# Patient Record
Sex: Female | Born: 1990 | Hispanic: No | Marital: Single | Smoking: Former smoker
Health system: Southern US, Community
[De-identification: ages and names within clinical notes are randomized; demographics above are authoritative.]

## PROBLEM LIST (undated history)

## (undated) ENCOUNTER — Inpatient Hospital Stay (HOSPITAL_COMMUNITY): Payer: Self-pay

## (undated) DIAGNOSIS — Z789 Other specified health status: Secondary | ICD-10-CM

## (undated) HISTORY — PX: DILATION AND CURETTAGE OF UTERUS: SHX78

---
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
## 2011-11-10 ENCOUNTER — Encounter (HOSPITAL_COMMUNITY): Payer: Self-pay | Admitting: *Deleted

## 2011-11-10 DIAGNOSIS — R109 Unspecified abdominal pain: Secondary | ICD-10-CM | POA: Insufficient documentation

## 2011-11-10 DIAGNOSIS — O99891 Other specified diseases and conditions complicating pregnancy: Secondary | ICD-10-CM | POA: Insufficient documentation

## 2011-11-10 LAB — CBC
Hemoglobin: 10.2 g/dL — ABNORMAL LOW (ref 12.0–15.0)
MCHC: 30.7 g/dL (ref 30.0–36.0)
RBC: 4.02 MIL/uL (ref 3.87–5.11)

## 2011-11-10 LAB — DIFFERENTIAL
Basophils Relative: 0 % (ref 0–1)
Lymphs Abs: 2.5 10*3/uL (ref 0.7–4.0)
Monocytes Relative: 10 % (ref 3–12)
Neutro Abs: 3.8 10*3/uL (ref 1.7–7.7)
Neutrophils Relative %: 53 % (ref 43–77)

## 2011-11-10 LAB — URINALYSIS, ROUTINE W REFLEX MICROSCOPIC
Glucose, UA: NEGATIVE mg/dL
Leukocytes, UA: NEGATIVE
pH: 6.5 (ref 5.0–8.0)

## 2011-11-10 LAB — PREGNANCY, URINE: Preg Test, Ur: POSITIVE — AB

## 2011-11-10 NOTE — ED Notes (Signed)
The pt is c/o abd pain since the middle of this months and she is due for a period but it has not come on yet.  She has taken 2 preg tests.  One was neg and one was pos

## 2011-11-11 ENCOUNTER — Emergency Department (HOSPITAL_COMMUNITY)
Admission: EM | Admit: 2011-11-11 | Discharge: 2011-11-11 | Disposition: A | Discharge status: Home / Self Care | Payer: Medicaid Other | Attending: Emergency Medicine | Admitting: Emergency Medicine

## 2011-11-11 ENCOUNTER — Emergency Department (HOSPITAL_COMMUNITY): Payer: Medicaid Other

## 2011-11-11 LAB — COMPREHENSIVE METABOLIC PANEL
ALT: 17 U/L (ref 0–35)
Albumin: 3.3 g/dL — ABNORMAL LOW (ref 3.5–5.2)
Alkaline Phosphatase: 55 U/L (ref 39–117)
BUN: 9 mg/dL (ref 6–23)
Chloride: 103 mEq/L (ref 96–112)
Potassium: 3.8 mEq/L (ref 3.5–5.1)
Total Bilirubin: 0.1 mg/dL — ABNORMAL LOW (ref 0.3–1.2)

## 2011-11-11 LAB — ABO/RH: ABO/RH(D): A POS

## 2011-11-11 NOTE — ED Notes (Signed)
Patient transported to Ultrasound 

## 2011-11-11 NOTE — Discharge Instructions (Signed)
ABCs of Pregnancy A Antepartum care is very important. Be sure you see your doctor and get prenatal care as soon as you think you are pregnant. At this time, you will be tested for infection, genetic abnormalities and potential problems with you and the pregnancy. This is the time to discuss diet, exercise, work, medications, labor, pain medication during labor and the possibility of a cesarean delivery. Ask any questions that may concern you. It is important to see your doctor regularly throughout your pregnancy. Avoid exposure to toxic substances and chemicals - such as cleaning solvents, lead and mercury, some insecticides, and paint. Pregnant women should avoid exposure to paint fumes, and fumes that cause you to feel ill, dizzy or faint. When possible, it is a good idea to have a pre-pregnancy consultation with your caregiver to begin some important recommendations your caregiver suggests such as, taking folic acid, exercising, quitting smoking, avoiding alcoholic beverages, etc. B Breastfeeding is the healthiest choice for both you and your baby. It has many nutritional benefits for the baby and health benefits for the mother. It also creates a very tight and loving bond between the baby and mother. Talk to your doctor, your family and friends, and your employer about how you choose to feed your baby and how they can support you in your decision. Not all birth defects can be prevented, but a woman can take actions that may increase her chance of having a healthy baby. Many birth defects happen very early in pregnancy, sometimes before a woman even knows she is pregnant. Birth defects or abnormalities of any child in your or the father's family should be discussed with your caregiver. Get a good support bra as your breast size changes. Wear it especially when you exercise and when nursing.  C Celebrate the news of your pregnancy with the your spouse/father and family. Childbirth classes are helpful to  take for you and the spouse/father because it helps to understand what happens during the pregnancy, labor and delivery. Cesarean delivery should be discussed with your doctor so you are prepared for that possibility. The pros and cons of circumcision if it is a boy, should be discussed with your pediatrician. Cigarette smoking during pregnancy can result in low birth weight babies. It has been associated with infertility, miscarriages, tubal pregnancies, infant death (mortality) and poor health (morbidity) in childhood. Additionally, cigarette smoking may cause long-term learning disabilities. If you smoke, you should try to quit before getting pregnant and not smoke during the pregnancy. Secondary smoke may also harm a mother and her developing baby. It is a good idea to ask people to stop smoking around you during your pregnancy and after the baby is born. Extra calcium is necessary when you are pregnant and is found in your prenatal vitamin, in dairy products, green leafy vegetables and in calcium supplements. D A healthy diet according to your current weight and height, along with vitamins and mineral supplements should be discussed with your caregiver. Domestic abuse or violence should be made known to your doctor right away to get the situation corrected. Drink more water when you exercise to keep hydrated. Discomfort of your back and legs usually develops and progresses from the middle of the second trimester through to delivery of the baby. This is because of the enlarging baby and uterus, which may also affect your balance. Do not take illegal drugs. Illegal drugs can seriously harm the baby and you. Drink extra fluids (water is best) throughout pregnancy to help   your body keep up with the increases in your blood volume. Drink at least 6 to 8 glasses of water, fruit juice, or milk each day. A good way to know you are drinking enough fluid is when your urine looks almost like clear water or is very light  yellow.  E Eat healthy to get the nutrients you and your unborn baby need. Your meals should include the five basic food groups. Exercise (30 minutes of light to moderate exercise a day) is important and encouraged during pregnancy, if there are no medical problems or problems with the pregnancy. Exercise that causes discomfort or dizziness should be stopped and reported to your caregiver. Emotions during pregnancy can change from being ecstatic to depression and should be understood by you, your partner and your family. F Fetal screening with ultrasound, amniocentesis and monitoring during pregnancy and labor is common and sometimes necessary. Take 400 micrograms of folic acid daily both before, when possible, and during the first few months of pregnancy to reduce the risk of birth defects of the brain and spine. All women who could possibly become pregnant should take a vitamin with folic acid, every day. It is also important to eat a healthy diet with fortified foods (enriched grain products, including cereals, rice, breads, and pastas) and foods with natural sources of folate (orange juice, green leafy vegetables, beans, peanuts, broccoli, asparagus, peas, and lentils). The father should be involved with all aspects of the pregnancy including, the prenatal care, childbirth classes, labor, delivery, and postpartum time. Fathers may also have emotional concerns about being a father, financial needs, and raising a family. G Genetic testing should be done appropriately. It is important to know your family and the father's history. If there have been problems with pregnancies or birth defects in your family, report these to your doctor. Also, genetic counselors can talk with you about the information you might need in making decisions about having a family. You can call a major medical center in your area for help in finding a board-certified genetic counselor. Genetic testing and counseling should be done  before pregnancy when possible, especially if there is a history of problems in the mother's or father's family. Certain ethnic backgrounds are more at risk for genetic defects. H Get familiar with the hospital where you will be having your baby. Get to know how long it takes to get there, the labor and delivery area, and the hospital procedures. Be sure your medical insurance is accepted there. Get your home ready for the baby including, clothes, the baby's room (when possible), furniture and car seat. Hand washing is important throughout the day, especially after handling raw meat and poultry, changing the baby's diaper or using the bathroom. This can help prevent the spread of many bacteria and viruses that cause infection. Your hair may become dry and thinner, but will return to normal a few weeks after the baby is born. Heartburn is a common problem that can be treated by taking antacids recommended by your caregiver, eating smaller meals 5 or 6 times a day, not drinking liquids when eating, drinking between meals and raising the head of your bed 2 to 3 inches. I Insurance to cover you, the baby, doctor and hospital should be reviewed so that you will be prepared to pay any costs not covered by your insurance plan. If you do not have medical insurance, there are usually clinics and services available for you in your community. Take 30 milligrams of iron during   your pregnancy as prescribed by your doctor to reduce the risk of low red blood cells (anemia) later in pregnancy. All women of childbearing age should eat a diet rich in iron. J There should be a joint effort for the mother, father and any other children to adapt to the pregnancy financially, emotionally, and psychologically during the pregnancy. Join a support group for moms-to-be. Or, join a class on parenting or childbirth. Have the family participate when possible. K Know your limits. Let your caregiver know if you experience any of the  following:   Pain of any kind.   Strong cramps.   You develop a lot of weight in a short period of time (5 pounds in 3 to 5 days).   Vaginal bleeding, leaking of amniotic fluid.   Headache, vision problems.   Dizziness, fainting, shortness of breath.   Chest pain.   Fever of 102 F (38.9 C) or higher.   Gush of clear fluid from your vagina.   Painful urination.   Domestic violence.   Irregular heartbeat (palpitations).   Rapid beating of the heart (tachycardia).   Constant feeling sick to your stomach (nauseous) and vomiting.   Trouble walking, fluid retention (edema).   Muscle weakness.   If your baby has decreased activity.   Persistent diarrhea.   Abnormal vaginal discharge.   Uterine contractions at 20-minute intervals.   Back pain that travels down your leg.  L Learn and practice that what you eat and drink should be in moderation and healthy for you and your baby. Legal drugs such as alcohol and caffeine are important issues for pregnant women. There is no safe amount of alcohol a woman can drink while pregnant. Fetal alcohol syndrome, a disorder characterized by growth retardation, facial abnormalities, and central nervous system dysfunction, is caused by a woman's use of alcohol during pregnancy. Caffeine, found in tea, coffee, soft drinks and chocolate, should also be limited. Be sure to read labels when trying to cut down on caffeine during pregnancy. More than 200 foods, beverages, and over-the-counter medications contain caffeine and have a high salt content! There are coffees and teas that do not contain caffeine. M Medical conditions such as diabetes, epilepsy, and high blood pressure should be treated and kept under control before pregnancy when possible, but especially during pregnancy. Ask your caregiver about any medications that may need to be changed or adjusted during pregnancy. If you are currently taking any medications, ask your caregiver if it  is safe to take them while you are pregnant or before getting pregnant when possible. Also, be sure to discuss any herbs or vitamins you are taking. They are medicines, too! Discuss with your doctor all medications, prescribed and over-the-counter, that you are taking. During your prenatal visit, discuss the medications your doctor may give you during labor and delivery. N Never be afraid to ask your doctor or caregiver questions about your health, the progress of the pregnancy, family problems, stressful situations, and recommendation for a pediatrician, if you do not have one. It is better to take all precautions and discuss any questions or concerns you may have during your office visits. It is a good idea to write down your questions before you visit the doctor. O Over-the-counter cough and cold remedies may contain alcohol or other ingredients that should be avoided during pregnancy. Ask your caregiver about prescription, herbs or over-the-counter medications that you are taking or may consider taking while pregnant.  P Physical activity during pregnancy can   benefit both you and your baby by lessening discomfort and fatigue, providing a sense of well-being, and increasing the likelihood of early recovery after delivery. Light to moderate exercise during pregnancy strengthens the belly (abdominal) and back muscles. This helps improve posture. Practicing yoga, walking, swimming, and cycling on a stationary bicycle are usually safe exercises for pregnant women. Avoid scuba diving, exercise at high altitudes (over 3000 feet), skiing, horseback riding, contact sports, etc. Always check with your doctor before beginning any kind of exercise, especially during pregnancy and especially if you did not exercise before getting pregnant. Q Queasiness, stomach upset and morning sickness are common during pregnancy. Eating a couple of crackers or dry toast before getting out of bed. Foods that you normally love may  make you feel sick to your stomach. You may need to substitute other nutritious foods. Eating 5 or 6 small meals a day instead of 3 large ones may make you feel better. Do not drink with your meals, drink between meals. Questions that you have should be written down and asked during your prenatal visits. R Read about and make plans to baby-proof your home. There are important tips for making your home a safer environment for your baby. Review the tips and make your home safer for you and your baby. Read food labels regarding calories, salt and fat content in the food. S Saunas, hot tubs, and steam rooms should be avoided while you are pregnant. Excessive high heat may be harmful during your pregnancy. Your caregiver will screen and examine you for sexually transmitted diseases and genetic disorders during your prenatal visits. Learn the signs of labor. Sexual relations while pregnant is safe unless there is a medical or pregnancy problem and your caregiver advises against it. T Traveling long distances should be avoided especially in the third trimester of your pregnancy. If you do have to travel out of state, be sure to take a copy of your medical records and medical insurance plan with you. You should not travel long distances without seeing your doctor first. Most airlines will not allow you to travel after 36 weeks of pregnancy. Toxoplasmosis is an infection caused by a parasite that can seriously harm an unborn baby. Avoid eating undercooked meat and handling cat litter. Be sure to wear gloves when gardening. Tingling of the hands and fingers is not unusual and is due to fluid retention. This will go away after the baby is born. U Womb (uterus) size increases during the first trimester. Your kidneys will begin to function more efficiently. This may cause you to feel the need to urinate more often. You may also leak urine when sneezing, coughing or laughing. This is due to the growing uterus pressing  against your bladder, which lies directly in front of and slightly under the uterus during the first few months of pregnancy. If you experience burning along with frequency of urination or bloody urine, be sure to tell your doctor. The size of your uterus in the third trimester may cause a problem with your balance. It is advisable to maintain good posture and avoid wearing high heels during this time. An ultrasound of your baby may be necessary during your pregnancy and is safe for you and your baby. V Vaccinations are an important concern for pregnant women. Get needed vaccines before pregnancy. Center for Disease Control (www.cdc.gov) has clear guidelines for the use of vaccines during pregnancy. Review the list, be sure to discuss it with your doctor. Prenatal vitamins are helpful   and healthy for you and the baby. Do not take extra vitamins except what is recommended. Taking too much of certain vitamins can cause overdose problems. Continuous vomiting should be reported to your caregiver. Varicose veins may appear especially if there is a family history of varicose veins. They should subside after the delivery of the baby. Support hose helps if there is leg discomfort. W Being overweight or underweight during pregnancy may cause problems. Try to get within 15 pounds of your ideal weight before pregnancy. Remember, pregnancy is not a time to be dieting! Do not stop eating or start skipping meals as your weight increases. Both you and your baby need the calories and nutrition you receive from a healthy diet. Be sure to consult with your doctor about your diet. There is a formula and diet plan available depending on whether you are overweight or underweight. Your caregiver or nutritionist can help and advise you if necessary. X Avoid X-rays. If you must have dental work or diagnostic tests, tell your dentist or physician that you are pregnant so that extra care can be taken. X-rays should only be taken when  the risks of not taking them outweigh the risk of taking them. If needed, only the minimum amount of radiation should be used. When X-rays are necessary, protective lead shields should be used to cover areas of the body that are not being X-rayed. Y Your baby loves you. Breastfeeding your baby creates a loving and very close bond between the two of you. Give your baby a healthy environment to live in while you are pregnant. Infants and children require constant care and guidance. Their health and safety should be carefully watched at all times. After the baby is born, rest or take a nap when the baby is sleeping. Z Get your ZZZs. Be sure to get plenty of rest. Resting on your side as often as possible, especially on your left side is advised. It provides the best circulation to your baby and helps reduce swelling. Try taking a nap for 30 to 45 minutes in the afternoon when possible. After the baby is born rest or take a nap when the baby is sleeping. Try elevating your feet for that amount of time when possible. It helps the circulation in your legs and helps reduce swelling.  Most information courtesy of the CDC. Document Released: 05/05/2005 Document Revised: 04/24/2011 Document Reviewed: 01/17/2009 [employer] Patient Information 2012 [employer]. 

## 2011-11-11 NOTE — ED Provider Notes (Signed)
History     CSN: [id number]  Arrival date & time 11/10/11  2232   First MD Initiated Contact with Patient 11/11/11 0103      Chief Complaint  Patient presents with  . Abdominal Pain    (Consider location/radiation/quality/duration/timing/severity/associated sxs/prior treatment) HPI History provided by patient. LMP of month ago. Patient concerned she may be pregnant with positive home pregnancy test and having some lower bowel discomfort. Some intermittent nausea but no vomiting. Currently without nausea. No vaginal discharge. Is sexually active. No trauma. No diarrhea. Does not have an OB/GYN. No history of ectopic pregnancy. No lateralizing pelvic pain. No back pain. No dysuria. History reviewed. No pertinent past medical history.  History reviewed. No pertinent past surgical history.  No family history on file.  History  Substance Use Topics  . Smoking status: Never Smoker   . Smokeless tobacco: Not on file  . Alcohol Use: No    OB History    Grav Para Term Preterm Abortions TAB SAB Ect Mult Living                  Review of Systems  Constitutional: Negative for fever and chills.  HENT: Negative for neck pain and neck stiffness.   Eyes: Negative for pain.  Respiratory: Negative for shortness of breath.   Cardiovascular: Negative for chest pain.  Gastrointestinal: Positive for abdominal pain. Negative for blood in stool and abdominal distention.  Genitourinary: Negative for dysuria.  Musculoskeletal: Negative for back pain.  Skin: Negative for rash.  Neurological: Negative for headaches.  All other systems reviewed and are negative.    Allergies  Review of patient's allergies indicates no known allergies.  Home Medications  No current outpatient prescriptions on file.  BP 129/84  Pulse 91  Temp 98.3 F (36.8 C) (Oral)  Resp 18  SpO2 100%  LMP 10/27/2011  Physical Exam  Constitutional: She is oriented to person, place, and time. She appears  well-developed and well-nourished.  HENT:  Head: Normocephalic and atraumatic.  Eyes: Conjunctivae and EOM are normal. Pupils are equal, round, and reactive to light.  Neck: Trachea normal. Neck supple. No thyromegaly present.  Cardiovascular: Normal rate, regular rhythm, S1 normal, S2 normal and normal pulses.     No systolic murmur is present   No diastolic murmur is present  Pulses:      Radial pulses are 2+ on the right side, and 2+ on the left side.  Pulmonary/Chest: Effort normal and breath sounds normal. She has no wheezes. She has no rhonchi. She has no rales. She exhibits no tenderness.  Abdominal: Soft. Normal appearance and bowel sounds are normal. She exhibits no distension and no mass. There is no tenderness. There is no rebound, no guarding, no CVA tenderness and negative Murphy's sign.  Musculoskeletal:       BLE:s Calves nontender, no cords or erythema, negative Homans sign  Neurological: She is alert and oriented to person, place, and time. She has normal strength. No cranial nerve deficit or sensory deficit. GCS eye subscore is 4. GCS verbal subscore is 5. GCS motor subscore is 6.  Skin: Skin is warm and dry. No rash noted. She is not diaphoretic.  Psychiatric: Her speech is normal.       Cooperative and appropriate    ED Course  Procedures (including critical care time)  Labs Reviewed  PREGNANCY, URINE - Abnormal; Notable for the following:    Preg Test, Ur POSITIVE (*)     All other components  within normal limits  CBC - Abnormal; Notable for the following:    Hemoglobin 10.2 (*)     HCT 33.2 (*)     MCH 25.4 (*)     All other components within normal limits  COMPREHENSIVE METABOLIC PANEL - Abnormal; Notable for the following:    Albumin 3.3 (*)     Total Bilirubin 0.1 (*)     All other components within normal limits  HCG, QUANTITATIVE, PREGNANCY - Abnormal; Notable for the following:    hCG, Beta Chain, Quant, S 7173 (*)     All other components within  normal limits  URINALYSIS, ROUTINE W REFLEX MICROSCOPIC  DIFFERENTIAL  ABO/RH   US Ob Comp Less 14 Wks  11/11/2011  *RADIOLOGY REPORT*  Clinical Data: 21 year old female with abdominal pain. Quantitative beta HCG pending.  Gestational age by LMP 5 weeks and 4 days.  OBSTETRIC <14 WK [place] AND TRANSVAGINAL OB US  Technique:  Both transabdominal and transvaginal ultrasound examinations were performed for complete evaluation of the gestation as well as the maternal uterus, adnexal regions, and pelvic cul-de-sac.  Transvaginal technique was performed to assess early pregnancy.  Comparison:  None.  Intrauterine gestational sac:  Small, single. Yolk sac: Not visible Embryo: Not visible  MSD: 6.7  mm  five    w three    d       [place] EDC: 07/10/2012  Maternal uterus/adnexae: No subchorionic hemorrhage or pelvic free fluid.  The right ovary measures 3.5 x 1.7 x 1.8 cm and is within normal limits. The left ovary measures 2.3 x 3.2 x 2.7 cm and contains a round echogenic structure measuring 1.8 cm diameter compatible with the corpus luteum.  IMPRESSION: 1.  Evidence of a small intrauterine gestational sac.  No yolk sac or embryo visible. 2.  No subchorionic hemorrhage or pelvic free fluid.  Left ovary probable corpus luteum. 3.  Correlation with serial quantitative BHCG and followup imaging will be necessary to fully exclude ectopic pregnancy.  Original Report Authenticated By: Harley Hallmark, M.D.   US Ob Transvaginal  11/11/2011  *RADIOLOGY REPORT*  Clinical Data: 21 year old female with abdominal pain. Quantitative beta HCG pending.  Gestational age by LMP 5 weeks and 4 days.  OBSTETRIC <14 WK [place] AND TRANSVAGINAL OB US  Technique:  Both transabdominal and transvaginal ultrasound examinations were performed for complete evaluation of the gestation as well as the maternal uterus, adnexal regions, and pelvic cul-de-sac.  Transvaginal technique was performed to assess early pregnancy.  Comparison:  None.  Intrauterine  gestational sac:  Small, single. Yolk sac: Not visible Embryo: Not visible  MSD: 6.7  mm  five    w three    d       [place] EDC: 07/10/2012  Maternal uterus/adnexae: No subchorionic hemorrhage or pelvic free fluid.  The right ovary measures 3.5 x 1.7 x 1.8 cm and is within normal limits. The left ovary measures 2.3 x 3.2 x 2.7 cm and contains a round echogenic structure measuring 1.8 cm diameter compatible with the corpus luteum.  IMPRESSION: 1.  Evidence of a small intrauterine gestational sac.  No yolk sac or embryo visible. 2.  No subchorionic hemorrhage or pelvic free fluid.  Left ovary probable corpus luteum. 3.  Correlation with serial quantitative BHCG and followup imaging will be necessary to fully exclude ectopic pregnancy.  Original Report Authenticated By: Harley Hallmark, M.D.    3:14 AM [place] and BhCG d/w radiologist who doubts ectopic and feels this  is an early IUP.   Serial abdominal exams without tenderness or indication for imaging. After ultrasound reviewed, result shared with patient and after discussion has been seen in the women's clinic in the past. Plan followup 48 hours for repeat hCG as indicated MDM   Positive pregnancy test with early IUP by ultrasound. Plan close OB/GYN followup in the clinic. Stable for discharge home with the indication for further workup or evaluation at this time. Patient agrees to start prenatal vitamins.        Sunnie Nielsen, MD 11/11/11 [phone]

## 2011-11-11 NOTE — ED Notes (Signed)
Visitors at bedside, nad noted, abc intact awaiting furthur orders and disposition.

## 2012-02-11 LAB — OB RESULTS CONSOLE ANTIBODY SCREEN: Antibody Screen: NEGATIVE

## 2012-02-11 LAB — OB RESULTS CONSOLE RPR: RPR: NONREACTIVE

## 2012-02-11 LAB — OB RESULTS CONSOLE RUBELLA ANTIBODY, IGM: Rubella: IMMUNE

## 2012-02-11 LAB — OB RESULTS CONSOLE HEPATITIS B SURFACE ANTIGEN: Hepatitis B Surface Ag: NEGATIVE

## 2012-02-11 LAB — OB RESULTS CONSOLE GC/CHLAMYDIA: Chlamydia: NEGATIVE

## 2012-04-16 ENCOUNTER — Encounter (HOSPITAL_COMMUNITY): Payer: Self-pay

## 2012-04-16 ENCOUNTER — Inpatient Hospital Stay (HOSPITAL_COMMUNITY)
Admission: AD | Admit: 2012-04-16 | Discharge: 2012-04-16 | Disposition: A | Discharge status: Home / Self Care | Payer: Medicaid Other | Source: Ambulatory Visit | Attending: Obstetrics and Gynecology | Admitting: Obstetrics and Gynecology

## 2012-04-16 DIAGNOSIS — B9689 Other specified bacterial agents as the cause of diseases classified elsewhere: Secondary | ICD-10-CM | POA: Insufficient documentation

## 2012-04-16 DIAGNOSIS — N938 Other specified abnormal uterine and vaginal bleeding: Secondary | ICD-10-CM | POA: Insufficient documentation

## 2012-04-16 DIAGNOSIS — A499 Bacterial infection, unspecified: Secondary | ICD-10-CM

## 2012-04-16 DIAGNOSIS — N949 Unspecified condition associated with female genital organs and menstrual cycle: Secondary | ICD-10-CM | POA: Insufficient documentation

## 2012-04-16 DIAGNOSIS — N760 Acute vaginitis: Secondary | ICD-10-CM | POA: Insufficient documentation

## 2012-04-16 HISTORY — DX: Other specified health status: Z78.9

## 2012-04-16 LAB — WET PREP, GENITAL: Trich, Wet Prep: NONE SEEN

## 2012-04-16 MED ORDER — METRONIDAZOLE 500 MG PO TABS: 500.0000 mg | ORAL_TABLET | Freq: Two times a day (BID) | ORAL | Status: DC

## 2012-04-16 NOTE — MAU Note (Signed)
Bleeding started this morning.  Denies recent intercourse, low lying placenta or previa.  No pain.  Initially started as a "lot of water, then blood; not so much now- only wearing panti liner".

## 2012-04-16 NOTE — MAU Provider Note (Signed)
  History     CSN: [id number]  Arrival date and time: 04/16/12 1019   First Provider Initiated Contact with Patient 04/16/12 1036      Chief Complaint  Patient presents with  . Vaginal Bleeding   HPI  Katherine Ayers is a 21 y.o. [id number]. She presents today with vaginal bleeding. She states that it started about an hour ago. She had filled a pantyliner with blood and passed a dime sized clot. She denies any pain. She confirms fetal movement. Most recent intercourse was 3 days ago. She denies any problems with the pregnancy.   No past medical history on file.  No past surgical history on file.  No family history on file.  History  Substance Use Topics  . Smoking status: Never Smoker   . Smokeless tobacco: Not on file  . Alcohol Use: No    Allergies: No Known Allergies  No prescriptions prior to admission    Review of Systems  Constitutional: Negative for fever.  Eyes: Negative for blurred vision.  Gastrointestinal: Negative for nausea, vomiting, abdominal pain, diarrhea and constipation.  Genitourinary: Negative for dysuria, urgency and frequency.  Neurological: Negative for headaches.   Physical Exam   There were no vitals taken for this visit.  Physical Exam  Nursing note and vitals reviewed. Constitutional: She is oriented to person, place, and time. She appears well-developed and well-nourished.  Cardiovascular: Normal rate.   Respiratory: Effort normal.  GI: Soft. She exhibits no distension.  Genitourinary:        External: normal Vagina: copious thick, yellow discharge. No pooling of fluid, no fluid seen from cervical os with valsalva. No blood seen in vault.  Cervix: closed/thick/high  Neurological: She is alert and oriented to person, place, and time.  Skin: Skin is warm and dry.  Psychiatric: She has a normal mood and affect.    MAU Course  Procedures  Dr. Dareen Piano notified of patient status. Ok for [employer] home once wet prep results are back.    Results for orders placed during the hospital encounter of 04/16/12 (from the past 24 hour(s))  WET PREP, GENITAL     Status: Abnormal   Collection Time   04/16/12 10:48 AM      Component Value Range   Yeast Wet Prep HPF POC NONE SEEN  NONE SEEN   Trich, Wet Prep NONE SEEN  NONE SEEN   Clue Cells Wet Prep HPF POC MODERATE (*) NONE SEEN   WBC, Wet Prep HPF POC MANY (*) NONE SEEN    Assessment and Plan  Bacterial Vaginosis Flagyl 500mg  PO [id number] FU with pcp as needed.  Tawnya Crook 04/16/2012, 10:36 AM

## 2012-04-18 LAB — GC/CHLAMYDIA PROBE AMP
CT Probe RNA: NEGATIVE
GC Probe RNA: NEGATIVE

## 2012-05-19 NOTE — L&D Delivery Note (Signed)
Delivery Note At 5:58 PM a viable and healthy female was delivered via Vaginal, Spontaneous Delivery (Presentation: Left Occiput Anterior).  APGAR: 9, 9; weight Pending.  Placenta status: Intact, Spontaneous.  Cord: 3 vessels   Anesthesia: Epidural, 10cc 1% lidocaine Episiotomy: None Lacerations: Right vaginal laceration Suture Repair: 3.0 vicryl Est. Blood Loss (mL): 400  Mom to postpartum.  Baby to nursery-stable.  Roseland Braun H. 07/09/2012, 7:08 PM

## 2012-07-09 ENCOUNTER — Encounter (HOSPITAL_COMMUNITY): Payer: Self-pay | Admitting: Anesthesiology

## 2012-07-09 ENCOUNTER — Encounter (HOSPITAL_COMMUNITY): Payer: Self-pay | Admitting: *Deleted

## 2012-07-09 ENCOUNTER — Inpatient Hospital Stay (HOSPITAL_COMMUNITY): Payer: Medicaid Other | Admitting: Anesthesiology

## 2012-07-09 ENCOUNTER — Inpatient Hospital Stay (HOSPITAL_COMMUNITY)
Admission: AD | Admit: 2012-07-09 | Discharge: 2012-07-11 | DRG: 775 | Disposition: A | Discharge status: Home / Self Care | Payer: Medicaid Other | Source: Ambulatory Visit | Attending: Obstetrics and Gynecology | Admitting: Obstetrics and Gynecology

## 2012-07-09 DIAGNOSIS — D649 Anemia, unspecified: Secondary | ICD-10-CM | POA: Diagnosis not present

## 2012-07-09 DIAGNOSIS — Z22330 Carrier of Group B streptococcus: Secondary | ICD-10-CM

## 2012-07-09 DIAGNOSIS — O99892 Other specified diseases and conditions complicating childbirth: Secondary | ICD-10-CM | POA: Diagnosis present

## 2012-07-09 DIAGNOSIS — O9903 Anemia complicating the puerperium: Secondary | ICD-10-CM | POA: Diagnosis not present

## 2012-07-09 LAB — CBC
HCT: 29 % — ABNORMAL LOW (ref 36.0–46.0)
MCHC: 30 g/dL (ref 30.0–36.0)
Platelets: 213 10*3/uL (ref 150–400)
RDW: 16.6 % — ABNORMAL HIGH (ref 11.5–15.5)

## 2012-07-09 MED ORDER — FENTANYL 2.5 MCG/ML BUPIVACAINE 1/10 % EPIDURAL INFUSION (WH - ANES)
14.0000 mL/h | INTRAMUSCULAR | Status: DC
  Administered 2012-07-09: 14 mL/h via EPIDURAL
  Filled 2012-07-09: qty 125

## 2012-07-09 MED ORDER — METHYLERGONOVINE MALEATE 0.2 MG/ML IJ SOLN: 0.2000 mg | INTRAMUSCULAR | Status: DC | PRN

## 2012-07-09 MED ORDER — OXYCODONE-ACETAMINOPHEN 5-325 MG PO TABS
1.0000 | ORAL_TABLET | ORAL | Status: DC | PRN
  Administered 2012-07-09: 1 via ORAL
  Filled 2012-07-09: qty 1

## 2012-07-09 MED ORDER — IBUPROFEN 600 MG PO TABS
600.0000 mg | ORAL_TABLET | Freq: Four times a day (QID) | ORAL | Status: DC
  Administered 2012-07-09 – 2012-07-11 (×8): 600 mg via ORAL
  Filled 2012-07-09 (×9): qty 1

## 2012-07-09 MED ORDER — FLEET ENEMA 7-19 GM/118ML RE ENEM: 1.0000 | ENEMA | Freq: Every day | RECTAL | Status: DC | PRN

## 2012-07-09 MED ORDER — INFLUENZA VIRUS VACC SPLIT PF IM SUSP
0.5000 mL | INTRAMUSCULAR | Status: AC
  Administered 2012-07-10: 0.5 mL via INTRAMUSCULAR
  Filled 2012-07-09: qty 0.5

## 2012-07-09 MED ORDER — OXYCODONE-ACETAMINOPHEN 5-325 MG PO TABS: 1.0000 | ORAL_TABLET | ORAL | Status: DC | PRN

## 2012-07-09 MED ORDER — LACTATED RINGERS IV SOLN
500.0000 mL | Freq: Once | INTRAVENOUS | Status: AC
  Administered 2012-07-09: 500 mL via INTRAVENOUS

## 2012-07-09 MED ORDER — OXYTOCIN 40 UNITS IN LACTATED RINGERS INFUSION - SIMPLE MED
62.5000 mL/h | INTRAVENOUS | Status: DC
  Filled 2012-07-09: qty 1000

## 2012-07-09 MED ORDER — ONDANSETRON HCL 4 MG/2ML IJ SOLN: 4.0000 mg | Freq: Four times a day (QID) | INTRAMUSCULAR | Status: DC | PRN

## 2012-07-09 MED ORDER — PHENYLEPHRINE 40 MCG/ML (10ML) SYRINGE FOR IV PUSH (FOR BLOOD PRESSURE SUPPORT): 80.0000 ug | PREFILLED_SYRINGE | INTRAVENOUS | Status: DC | PRN

## 2012-07-09 MED ORDER — PRENATAL MULTIVITAMIN CH
1.0000 | ORAL_TABLET | Freq: Every day | ORAL | Status: DC
  Administered 2012-07-09 – 2012-07-11 (×3): 1 via ORAL
  Filled 2012-07-09 (×4): qty 1

## 2012-07-09 MED ORDER — IBUPROFEN 600 MG PO TABS: 600.0000 mg | ORAL_TABLET | Freq: Four times a day (QID) | ORAL | Status: DC | PRN

## 2012-07-09 MED ORDER — LACTATED RINGERS IV SOLN: 500.0000 mL | INTRAVENOUS | Status: DC | PRN

## 2012-07-09 MED ORDER — LANOLIN HYDROUS EX OINT: TOPICAL_OINTMENT | CUTANEOUS | Status: DC | PRN

## 2012-07-09 MED ORDER — BENZOCAINE-MENTHOL 20-0.5 % EX AERO
1.0000 "application " | INHALATION_SPRAY | CUTANEOUS | Status: DC | PRN
  Administered 2012-07-09: 1 via TOPICAL
  Filled 2012-07-09: qty 56

## 2012-07-09 MED ORDER — ACETAMINOPHEN 325 MG PO TABS: 650.0000 mg | ORAL_TABLET | ORAL | Status: DC | PRN

## 2012-07-09 MED ORDER — LIDOCAINE HCL (PF) 1 % IJ SOLN
30.0000 mL | INTRAMUSCULAR | Status: DC | PRN
  Administered 2012-07-09: 30 mL via SUBCUTANEOUS
  Filled 2012-07-09 (×2): qty 30

## 2012-07-09 MED ORDER — OXYTOCIN BOLUS FROM INFUSION
500.0000 mL | INTRAVENOUS | Status: DC
  Administered 2012-07-09: 500 mL via INTRAVENOUS

## 2012-07-09 MED ORDER — ONDANSETRON HCL 4 MG/2ML IJ SOLN: 4.0000 mg | INTRAMUSCULAR | Status: DC | PRN

## 2012-07-09 MED ORDER — DIPHENHYDRAMINE HCL 25 MG PO CAPS: 25.0000 mg | ORAL_CAPSULE | Freq: Four times a day (QID) | ORAL | Status: DC | PRN

## 2012-07-09 MED ORDER — SODIUM CHLORIDE 0.9 % IV SOLN
2.0000 g | Freq: Four times a day (QID) | INTRAVENOUS | Status: DC
  Administered 2012-07-09: 2 g via INTRAVENOUS
  Filled 2012-07-09 (×3): qty 2000

## 2012-07-09 MED ORDER — PHENYLEPHRINE 40 MCG/ML (10ML) SYRINGE FOR IV PUSH (FOR BLOOD PRESSURE SUPPORT)
80.0000 ug | PREFILLED_SYRINGE | INTRAVENOUS | Status: DC | PRN
  Filled 2012-07-09: qty 5

## 2012-07-09 MED ORDER — SENNOSIDES-DOCUSATE SODIUM 8.6-50 MG PO TABS
2.0000 | ORAL_TABLET | Freq: Every day | ORAL | Status: DC
  Administered 2012-07-09 – 2012-07-10 (×2): 2 via ORAL

## 2012-07-09 MED ORDER — TETANUS-DIPHTH-ACELL PERTUSSIS 5-2.5-18.5 LF-MCG/0.5 IM SUSP
0.5000 mL | Freq: Once | INTRAMUSCULAR | Status: AC
  Administered 2012-07-10: 0.5 mL via INTRAMUSCULAR
  Filled 2012-07-09: qty 0.5

## 2012-07-09 MED ORDER — EPHEDRINE 5 MG/ML INJ: 10.0000 mg | INTRAVENOUS | Status: DC | PRN

## 2012-07-09 MED ORDER — METHYLERGONOVINE MALEATE 0.2 MG PO TABS: 0.2000 mg | ORAL_TABLET | ORAL | Status: DC | PRN

## 2012-07-09 MED ORDER — BUTORPHANOL TARTRATE 1 MG/ML IJ SOLN: 1.0000 mg | INTRAMUSCULAR | Status: DC | PRN

## 2012-07-09 MED ORDER — EPHEDRINE 5 MG/ML INJ
10.0000 mg | INTRAVENOUS | Status: DC | PRN
  Filled 2012-07-09: qty 4

## 2012-07-09 MED ORDER — LIDOCAINE HCL (PF) 1 % IJ SOLN
INTRAMUSCULAR | Status: DC | PRN
  Administered 2012-07-09 (×4): 4 mL

## 2012-07-09 MED ORDER — ZOLPIDEM TARTRATE 5 MG PO TABS: 5.0000 mg | ORAL_TABLET | Freq: Every evening | ORAL | Status: DC | PRN

## 2012-07-09 MED ORDER — ONDANSETRON HCL 4 MG PO TABS: 4.0000 mg | ORAL_TABLET | ORAL | Status: DC | PRN

## 2012-07-09 MED ORDER — SIMETHICONE 80 MG PO CHEW: 80.0000 mg | CHEWABLE_TABLET | ORAL | Status: DC | PRN

## 2012-07-09 MED ORDER — DIBUCAINE 1 % RE OINT: 1.0000 "application " | TOPICAL_OINTMENT | RECTAL | Status: DC | PRN

## 2012-07-09 MED ORDER — DIPHENHYDRAMINE HCL 50 MG/ML IJ SOLN: 12.5000 mg | INTRAMUSCULAR | Status: DC | PRN

## 2012-07-09 MED ORDER — CITRIC ACID-SODIUM CITRATE 334-500 MG/5ML PO SOLN: 30.0000 mL | ORAL | Status: DC | PRN

## 2012-07-09 MED ORDER — WITCH HAZEL-GLYCERIN EX PADS
1.0000 "application " | MEDICATED_PAD | CUTANEOUS | Status: DC | PRN
  Administered 2012-07-10: 1 via TOPICAL

## 2012-07-09 MED ORDER — LACTATED RINGERS IV SOLN
INTRAVENOUS | Status: DC
  Administered 2012-07-09 (×2): via INTRAVENOUS

## 2012-07-09 NOTE — MAU Note (Signed)
Pt presents to r/o labor.  Contractions started at 0400 this morning, and are every .  Denies any LOF or bleeding.  Reports good fetal movement.

## 2012-07-09 NOTE — Anesthesia Preprocedure Evaluation (Signed)
Anesthesia Evaluation  Patient identified by MRN, date of birth, ID band Patient awake    Reviewed: Allergy & Precautions, H&P , NPO status , Patient's Chart, lab work & pertinent test results, reviewed documented beta blocker date and time   History of Anesthesia Complications Negative for: history of anesthetic complications  Airway Mallampati: I TM Distance: >3 FB Neck ROM: full    Dental  (+) Teeth Intact   Pulmonary neg pulmonary ROS,  breath sounds clear to auscultation        Cardiovascular negative cardio ROS  Rhythm:regular Rate:Normal     Neuro/Psych negative neurological ROS  negative psych ROS   GI/Hepatic negative GI ROS, Neg liver ROS,   Endo/Other  negative endocrine ROS  Renal/GU negative Renal ROS     Musculoskeletal   Abdominal   Peds  Hematology  (+) anemia ,   Anesthesia Other Findings   Reproductive/Obstetrics (+) Pregnancy                           Anesthesia Physical Anesthesia Plan  ASA: II  Anesthesia Plan: Epidural   Post-op Pain Management:    Induction:   Airway Management Planned:   Additional Equipment:   Intra-op Plan:   Post-operative Plan:   Informed Consent: I have reviewed the patients History and Physical, chart, labs and discussed the procedure including the risks, benefits and alternatives for the proposed anesthesia with the patient or authorized representative who has indicated his/her understanding and acceptance.     Plan Discussed with:   Anesthesia Plan Comments:         Anesthesia Quick Evaluation  

## 2012-07-09 NOTE — Anesthesia Postprocedure Evaluation (Signed)
  Anesthesia Post-op Note  Patient: Katherine Ayers  Procedure(s) Performed: * No procedures listed *  Patient Location: PACU and Mother/Baby  Anesthesia Type:Epidural  Level of Consciousness: awake, alert  and oriented  Airway and Oxygen Therapy: Patient Spontanous Breathing  Post-op Pain: none  Post-op Assessment: Post-op Vital signs reviewed  Post-op Vital Signs: Reviewed and stable  Complications: No apparent anesthesia complications

## 2012-07-09 NOTE — Progress Notes (Signed)
Dr Tenny Craw discovered a positive GBS urine culture from early in pregnancy when chart was reviewed for h&p.  RN had previously documented a negative GBS status from a culture done on 06/08/2012.  Pt allowed to labor down for a bolus dose of Ampicillin 2g IV.  Pt will begin pushing 15-20 min after ampicillin administration

## 2012-07-09 NOTE — H&P (Signed)
Katherine Ayers is a 22 y.o. female presenting for labor  Pt presents to MAU complaining of contractions and is confirmed to be in labor. She is 6/90/-2.  Her pregnancy has been complicated by late prenatal care.   History OB History   Grav Para Term Preterm Abortions TAB SAB Ect Mult Living   3    2 2     0     Past Medical History  Diagnosis Date  . No pertinent past medical history    Past Surgical History  Procedure Laterality Date  . Dilation and curettage of uterus     Family History: family history is negative for Other. Social History:  reports that she has never smoked. She has never used smokeless tobacco. She reports that she does not drink alcohol or use illicit drugs.   Prenatal Transfer Tool  Maternal Diabetes: No Genetic Screening: Normal Maternal Ultrasounds/Referrals: Normal Fetal Ultrasounds or other Referrals:  None Maternal Substance Abuse:  No Significant Maternal Medications:  None Significant Maternal Lab Results:  None Other Comments:  None  ROS  Dilation: 10 Effacement (%): 100 Station: -1 Exam by:: Torrin Crihfield Blood pressure 144/98, pulse 103, temperature 97.9 F (36.6 C), temperature source Oral, resp. rate 20, height 5' 3.5" (1.613 m), weight 78.926 kg (174 lb), last menstrual period 10/27/2011, SpO2 99.00%. Exam Physical Exam  Prenatal labs: ABO, Rh: --/--/A POS (06/25 0115) Antibody: Negative (09/25 0000) Rubella: Immune (09/25 0000) RPR: Nonreactive (09/25 0000)  HBsAg: Negative (09/25 0000)  HIV: Non-reactive (09/25 0000)  GBS: Positive (02/21 0000)   Assessment/Plan: GBS noted to be positive in prenatals when patient complete.  2 grams of ancef IV x 1 now   Caron Ode H. 07/09/2012, 5:12 PM

## 2012-07-09 NOTE — Anesthesia Procedure Notes (Signed)
Epidural Patient location during procedure: OB Start time: 07/09/2012 1:41 PM  Staffing Performed by: anesthesiologist   Preanesthetic Checklist Completed: patient identified, site marked, surgical consent, pre-op evaluation, timeout performed, IV checked, risks and benefits discussed and monitors and equipment checked  Epidural Patient position: sitting Prep: site prepped and draped and DuraPrep Patient monitoring: continuous pulse ox and blood pressure Approach: midline Injection technique: LOR air  Needle:  Needle type: Tuohy  Needle gauge: 17 G Needle length: 9 cm and 9 Needle insertion depth: 5 cm cm Catheter type: closed end flexible Catheter size: 19 Gauge Catheter at skin depth: 10 cm Test dose: negative  Assessment Events: blood not aspirated, injection not painful, no injection resistance, negative IV test and no paresthesia  Additional Notes Discussed risk of headache, infection, bleeding, nerve injury and failed or incomplete block.  Patient voices understanding and wishes to proceed.  Epidural placed easily on first attempt.  No paresthesia.  Patient tolerated procedure well with no apparent complications.  Jasmine December, MD Reason for block:procedure for pain

## 2012-07-10 LAB — CBC
MCH: 22.9 pg — ABNORMAL LOW (ref 26.0–34.0)
MCHC: 29.3 g/dL — ABNORMAL LOW (ref 30.0–36.0)
MCV: 78.1 fL (ref 78.0–100.0)
Platelets: 204 10*3/uL (ref 150–400)
RBC: 2.88 MIL/uL — ABNORMAL LOW (ref 3.87–5.11)
RDW: 16.5 % — ABNORMAL HIGH (ref 11.5–15.5)

## 2012-07-10 MED ORDER — FERROUS SULFATE 325 (65 FE) MG PO TABS
325.0000 mg | ORAL_TABLET | Freq: Two times a day (BID) | ORAL | Status: DC
  Administered 2012-07-10 – 2012-07-11 (×3): 325 mg via ORAL
  Filled 2012-07-10 (×3): qty 1

## 2012-07-10 NOTE — Progress Notes (Signed)
Critical Hgb received from lab at 0620 of 6.7. Patient asymptomatic, VSS, lochia scant. Dr. Tenny Craw notified. No new orders. Will continue to monitor patient.

## 2012-07-10 NOTE — Progress Notes (Signed)
Post Partum Day 1 Subjective: no complaints, up ad lib, voiding and tolerating PO  Objective: Filed Vitals:   07/09/12 2105 07/10/12 0100 07/10/12 0622 07/10/12 0900  BP: 128/86 118/79 117/73 124/81  Pulse: 89 97 84 96  Temp: 98 F (36.7 C) 98.3 F (36.8 C) 98.5 F (36.9 C) 98.1 F (36.7 C)  TempSrc: Oral Oral Oral Oral  Resp: 18 20 18 18   Height:      Weight:      SpO2:    95%    Physical Exam:  General: alert, cooperative and appears stated age 22: appropriate Uterine Fundus: firm   Recent Labs  07/09/12 1240 07/10/12 0540  HGB 8.7* 6.7*  HCT 29.0* 22.5*    Assessment/Plan: Plan for discharge tomorrow Anemia:  Pt had anemia prior to delivery that has now worsened with birth from delivery.  Will continue iron therapy.  Patient is without symptoms and is hemodynamically stable.  No transfusion recommended at this time   LOS: 1 day   Nevayah Faust H. 07/10/2012, 9:27 AM

## 2012-07-11 ENCOUNTER — Encounter (HOSPITAL_COMMUNITY): Payer: Self-pay | Admitting: *Deleted

## 2012-07-11 MED ORDER — HYDROCODONE-ACETAMINOPHEN 5-500 MG PO TABS: 1.0000 | ORAL_TABLET | Freq: Four times a day (QID) | ORAL | Status: DC | PRN

## 2012-07-11 MED ORDER — IBUPROFEN 600 MG PO TABS: 600.0000 mg | ORAL_TABLET | Freq: Four times a day (QID) | ORAL | Status: DC | PRN

## 2012-07-11 MED ORDER — DOCUSATE SODIUM 100 MG PO CAPS: 100.0000 mg | ORAL_CAPSULE | Freq: Two times a day (BID) | ORAL | Status: DC

## 2012-07-11 NOTE — Discharge Summary (Signed)
Obstetric Discharge Summary Reason for Admission: onset of labor Prenatal Procedures: ultrasound Intrapartum Procedures: spontaneous vaginal delivery Postpartum Procedures: none Complications-Operative and Postpartum: none Hemoglobin  Date Value Range Status  07/10/2012 6.7* 12.0 - 15.0 g/dL Final     REPEATED TO VERIFY     CRITICAL RESULT CALLED TO, READ BACK BY AND VERIFIED WITH:     ALICIA HARGROVE RN.@0620  ON 07/10/12 BY CALDWELL T     DELTA CHECK NOTED     HCT  Date Value Range Status  07/10/2012 22.5* 36.0 - 46.0 % Final    Physical Exam:  General: alert, cooperative and appears stated age 22: appropriate Uterine Fundus: firm  Discharge Diagnoses: Term Pregnancy-delivered  Discharge Information: Date: 07/11/2012 Activity: pelvic rest Diet: routine Medications: Ibuprofen, Colace and Vicodin Condition: improved Instructions: refer to practice specific booklet Discharge to: home Follow-up Information   Follow up with Almon Hercules., MD In 4 weeks. (For a postpartum evaluation)    Contact information:   [address] [place] [address] [phone]       Newborn Data: Live born female  Birth Weight: 6 lb 13.4 oz (3100 g) APGAR: 9, 9  Home with mother.  Torben Soloway H. 07/11/2012, 11:04 AM

## 2012-07-12 NOTE — Progress Notes (Signed)
Post discharge chart review completed.  

## 2012-07-16 ENCOUNTER — Telehealth (HOSPITAL_COMMUNITY): Payer: Self-pay | Admitting: *Deleted

## 2012-07-16 NOTE — Telephone Encounter (Signed)
Resolve episode 

## 2012-11-20 IMAGING — US US OB COMP LESS 14 WK
1 series · 13 of 28 positions shown · non-contrast
Comparison: None.

CLINICAL DATA: 21-year-old female with abdominal pain.
Quantitative beta HCG pending.  Gestational age by LMP 5 weeks and
4 days.

OBSTETRIC <14 WK US AND TRANSVAGINAL OB US
TECHNIQUE: Both transabdominal and transvaginal ultrasound
examinations were performed for complete evaluation of the
gestation as well as the maternal uterus, adnexal regions, and
pelvic cul-de-sac.  Transvaginal technique was performed to assess
early pregnancy.

[Series 1: us ob comp less 14 wk · 0.26mm/px · 77 acquisitions, 13 frames shown]
[im 3/77]
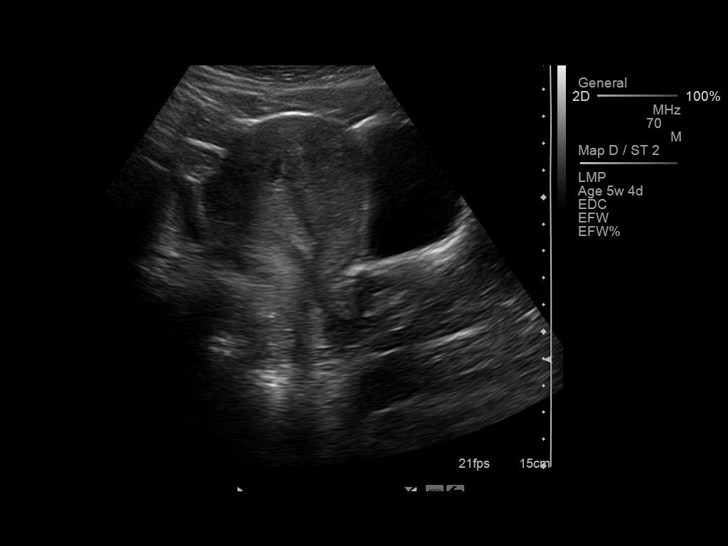
[im 9/77]
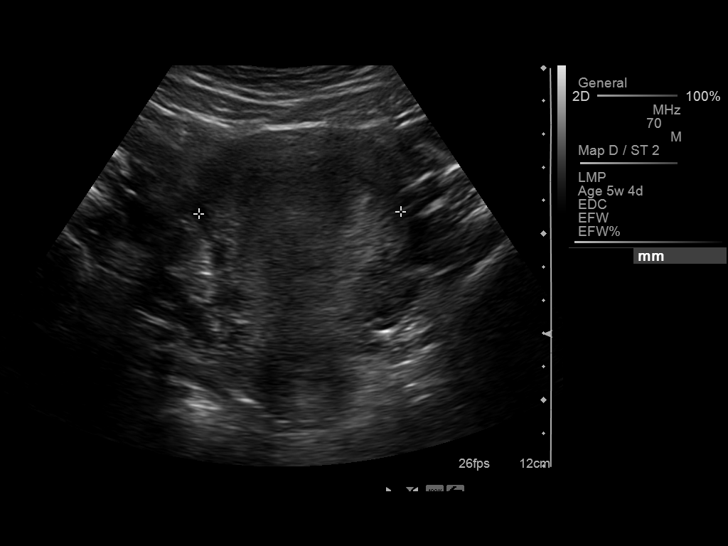
[im 15/77]
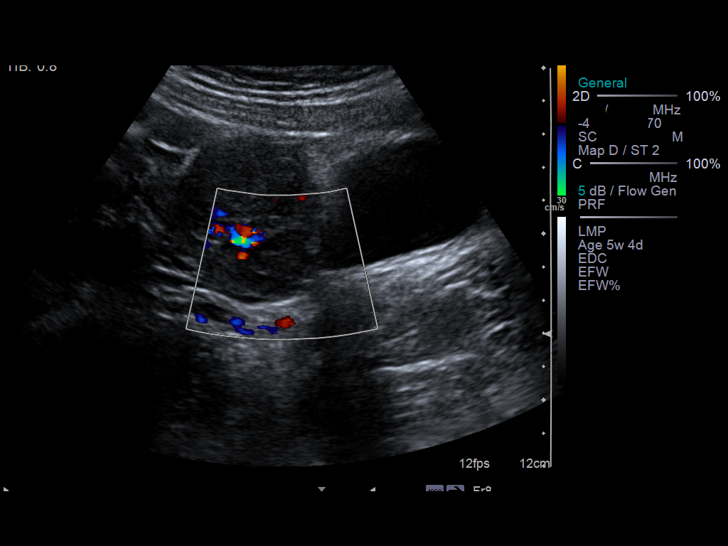
[im 20/77]
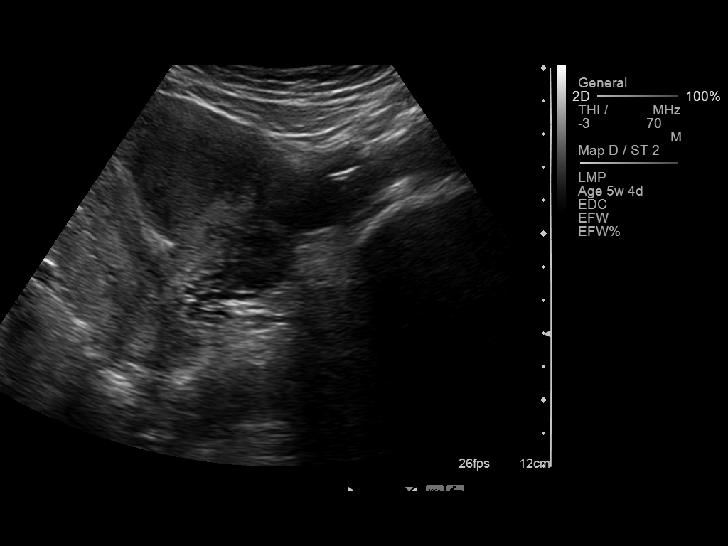
[im 26/77]
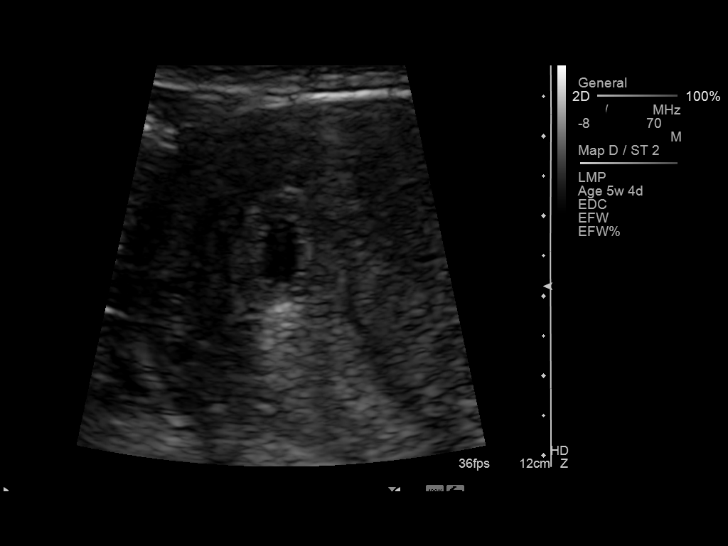
[im 31/77]
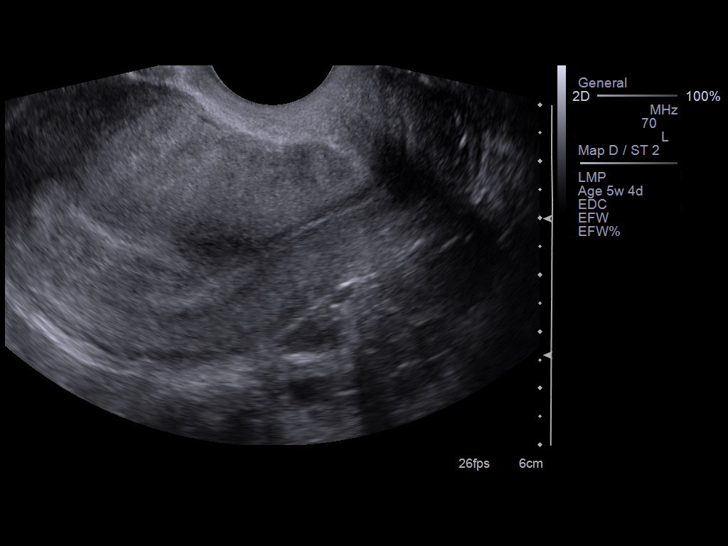
[im 40/77]
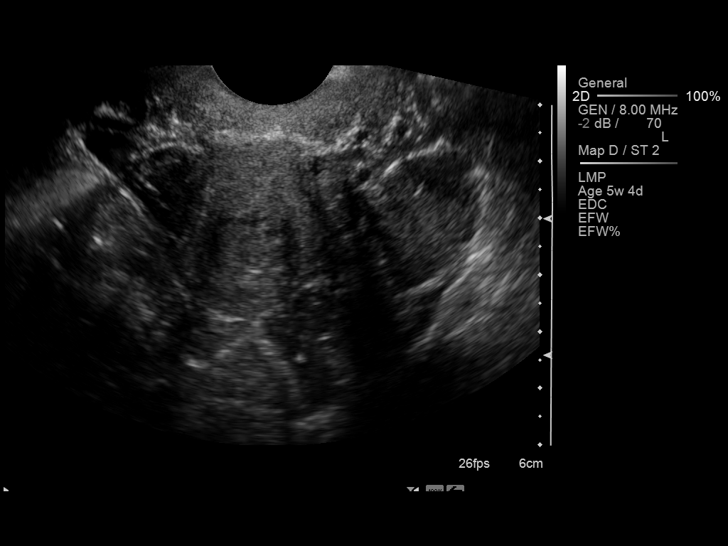
[im 46/77]
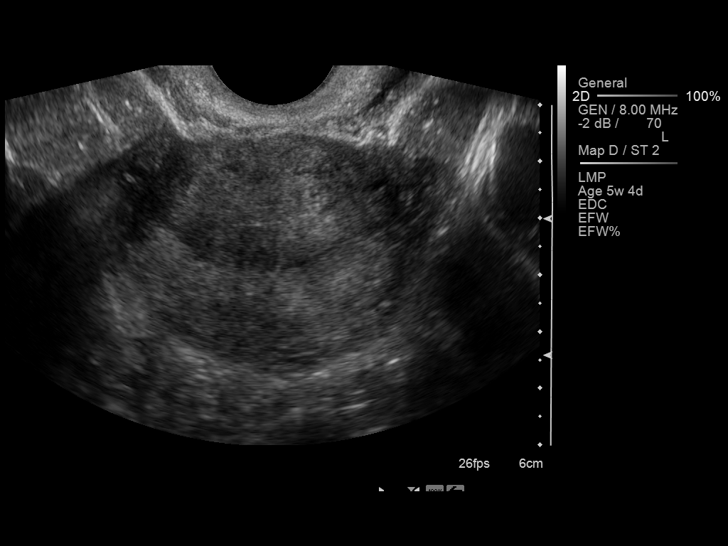
[im 51/77]
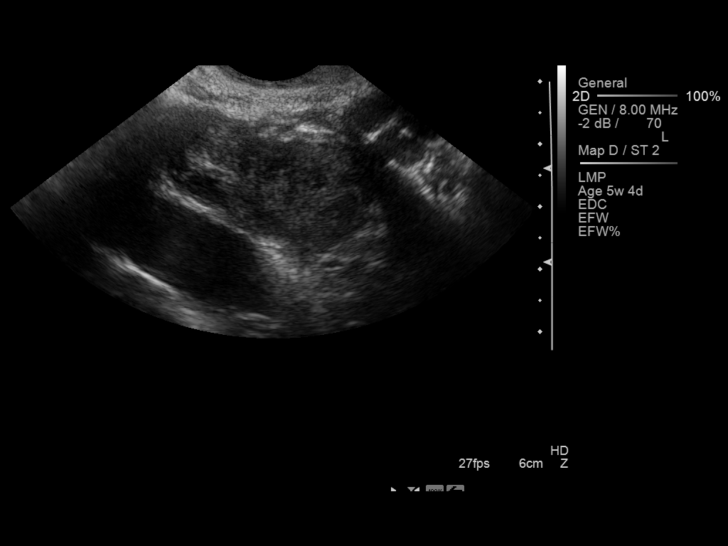
[im 57/77]
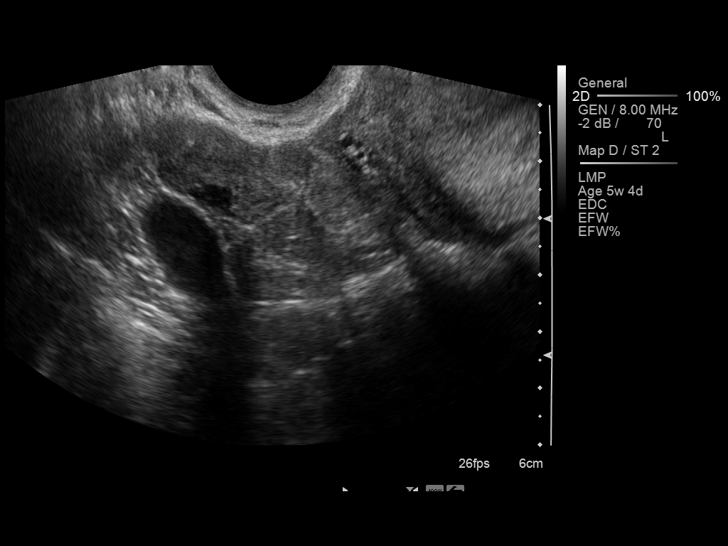
[im 62/77]
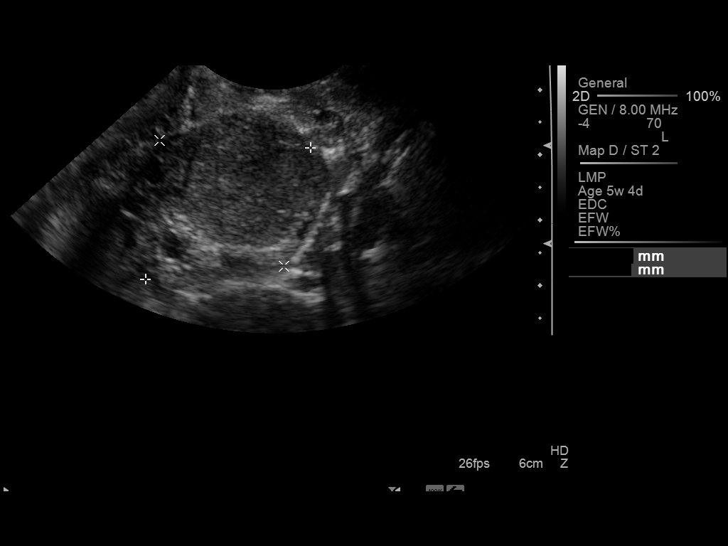
[im 68/77]
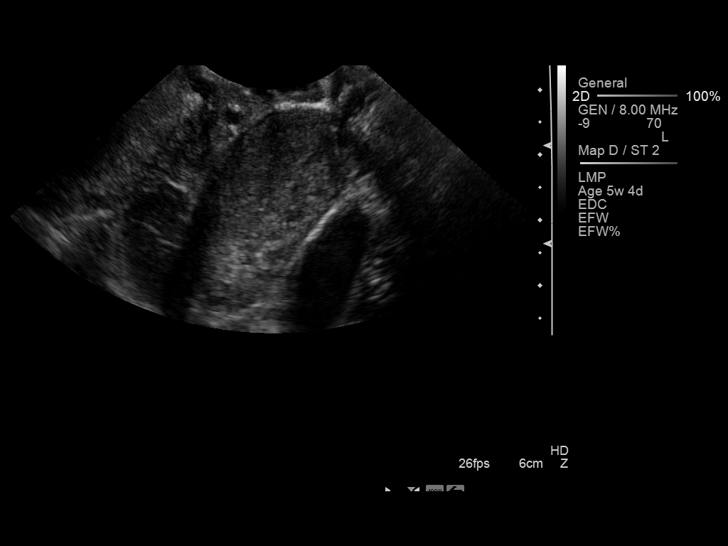
[im 74/77]
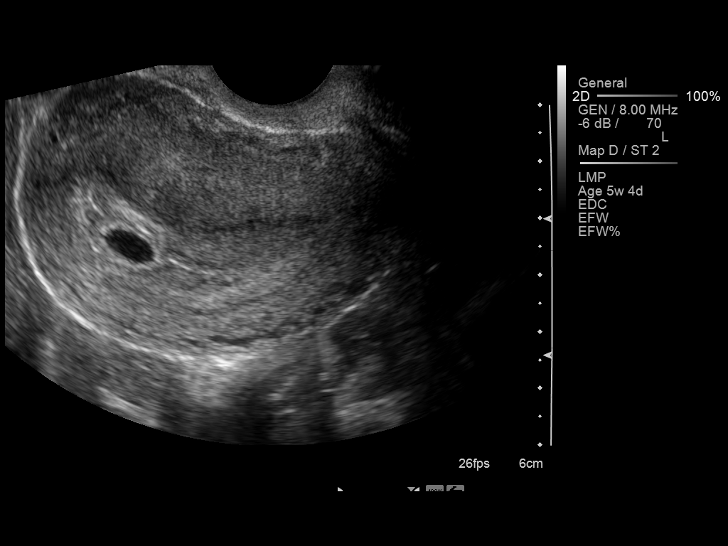

[13 of 28 positions shown; findings below may reference images not displayed]

Intrauterine gestational sac:  Small, single.
Yolk sac: Not visible
Embryo: Not visible

MSD: 6.7  mm  five    w three    d
      US EDC: 07/10/2012

Maternal uterus/adnexae:
No subchorionic hemorrhage or pelvic free fluid.  The right ovary
measures 3.5 x 1.7 x 1.8 cm and is within normal limits. The left
ovary measures 2.3 x 3.2 x 2.7 cm and contains a round echogenic
structure measuring 1.8 cm diameter compatible with the corpus
luteum.
IMPRESSION: 1.  Evidence of a small intrauterine gestational sac.  No yolk sac
or embryo visible.
2.  No subchorionic hemorrhage or pelvic free fluid.  Left ovary
probable corpus luteum.
3.  Correlation with serial quantitative BHCG and followup imaging
will be necessary to fully exclude ectopic pregnancy.

## 2014-03-20 ENCOUNTER — Encounter (HOSPITAL_COMMUNITY): Payer: Self-pay | Admitting: *Deleted

## 2017-05-19 NOTE — L&D Delivery Note (Signed)
Delivery Note Patient pushed well with two contractions.  At 11:44 AM a viable female was delivered via  (Presentation: Occiput anterior ).  APGAR: 8, 9; weight pending .   Placenta status: Spontaneous, in tact.  Cord: 3V with the following complications: None .  Cord pH: n/a  Anesthesia:  Epidural Episiotomy:  None Lacerations:  None Suture Repair: N/a Est. Blood Loss (mL):  <50 mL  Mom to postpartum.  Baby to Couplet care / Skin to Skin.  Katherine Ayers Katherine Ayers 05/01/2018, 11:58 AM

## 2018-04-21 LAB — OB RESULTS CONSOLE GBS: GBS: NEGATIVE

## 2018-04-29 ENCOUNTER — Encounter (HOSPITAL_COMMUNITY): Payer: Self-pay | Admitting: *Deleted

## 2018-04-29 ENCOUNTER — Inpatient Hospital Stay (HOSPITAL_COMMUNITY)
Admission: AD | Admit: 2018-04-29 | Discharge: 2018-04-29 | Disposition: A | Discharge status: Home / Self Care | Payer: Medicaid Other | Source: Ambulatory Visit | Attending: Obstetrics and Gynecology | Admitting: Obstetrics and Gynecology

## 2018-04-29 DIAGNOSIS — O163 Unspecified maternal hypertension, third trimester: Secondary | ICD-10-CM | POA: Diagnosis present

## 2018-04-29 DIAGNOSIS — Z87891 Personal history of nicotine dependence: Secondary | ICD-10-CM | POA: Diagnosis not present

## 2018-04-29 DIAGNOSIS — O133 Gestational [pregnancy-induced] hypertension without significant proteinuria, third trimester: Secondary | ICD-10-CM | POA: Diagnosis not present

## 2018-04-29 DIAGNOSIS — Z3A38 38 weeks gestation of pregnancy: Secondary | ICD-10-CM

## 2018-04-29 DIAGNOSIS — Z3689 Encounter for other specified antenatal screening: Secondary | ICD-10-CM

## 2018-04-29 LAB — CBC
HEMATOCRIT: 31.1 % — AB (ref 36.0–46.0)
HEMOGLOBIN: 9.4 g/dL — AB (ref 12.0–15.0)
MCH: 25.9 pg — AB (ref 26.0–34.0)
MCHC: 30.2 g/dL (ref 30.0–36.0)
MCV: 85.7 fL (ref 80.0–100.0)
PLATELETS: 152 10*3/uL (ref 150–400)
RBC: 3.63 MIL/uL — AB (ref 3.87–5.11)
RDW: 16.2 % — AB (ref 11.5–15.5)
WBC: 6.4 10*3/uL (ref 4.0–10.5)
nRBC: 0 % (ref 0.0–0.2)

## 2018-04-29 LAB — COMPREHENSIVE METABOLIC PANEL
ALT: 15 U/L (ref 0–44)
AST: 25 U/L (ref 15–41)
Albumin: 2.7 g/dL — ABNORMAL LOW (ref 3.5–5.0)
Alkaline Phosphatase: 135 U/L — ABNORMAL HIGH (ref 38–126)
Anion gap: 9 (ref 5–15)
BUN: 7 mg/dL (ref 6–20)
CALCIUM: 8.6 mg/dL — AB (ref 8.9–10.3)
CHLORIDE: 104 mmol/L (ref 98–111)
CO2: 21 mmol/L — AB (ref 22–32)
Creatinine, Ser: 0.56 mg/dL (ref 0.44–1.00)
GFR calc non Af Amer: >60 mL/min (ref >60)
Glucose, Bld: 83 mg/dL (ref 70–99)
Potassium: 3.7 mmol/L (ref 3.5–5.1)
Sodium: 134 mmol/L — ABNORMAL LOW (ref 135–145)
Total Bilirubin: 0.5 mg/dL (ref 0.3–1.2)
Total Protein: 6.6 g/dL (ref 6.5–8.1)

## 2018-04-29 LAB — URINALYSIS, ROUTINE W REFLEX MICROSCOPIC
BILIRUBIN URINE: NEGATIVE
Glucose, UA: NEGATIVE mg/dL
Hgb urine dipstick: NEGATIVE
KETONES UR: 5 mg/dL — AB
NITRITE: NEGATIVE
PROTEIN: 100 mg/dL — AB
SPECIFIC GRAVITY, URINE: 1.04 — AB (ref 1.005–1.030)
pH: 5 (ref 5.0–8.0)

## 2018-04-29 LAB — PROTEIN / CREATININE RATIO, URINE
Creatinine, Urine: 468 mg/dL
Protein Creatinine Ratio: 0.07 mg/mg{Cre} (ref 0.00–0.15)
Total Protein, Urine: 34 mg/dL

## 2018-04-29 NOTE — MAU Provider Note (Addendum)
History     CSN: [id number]  Arrival date and time: 04/29/18 1511   First Provider Initiated Contact with Patient 04/29/18 1553      Chief Complaint  Patient presents with  . Hypertension   [id number] @38 .5 wks sent from office for elevated BP. Denies visual disturbances, RUQ pain, SOB, and CP. Had a HA earlier but took Tylenol and got better. Reports good FM. Her pregnancy has been uncomplicated.    OB History    Gravida  4   Para  1   Term  1   Preterm  0   AB  2   Living  1     SAB  0   TAB  2   Ectopic  0   Multiple  0   Live Births  1           Past Medical History:  Diagnosis Date  . No pertinent past medical history     Past Surgical History:  Procedure Laterality Date  . DILATION AND CURETTAGE OF UTERUS      Family History  Problem Relation Age of Onset  . Other Neg Hx     Social History   Tobacco Use  . Smoking status: Former Smoker    Last attempt to quit: 10/28/2017    Years since quitting: 0.5  . Smokeless tobacco: Never Used  Substance Use Topics  . Alcohol use: Not Currently    Comment: none in pregnancy  . Drug use: No    Allergies: No Known Allergies  Medications Prior to Admission  Medication Sig Dispense Refill Last Dose  . acetaminophen (TYLENOL) 500 MG tablet Take 500 mg by mouth every 6 (six) hours as needed for mild pain.   04/29/2018 at Unknown time  . Prenatal Vit-Fe Fumarate-FA (PRENATAL MULTIVITAMIN) TABS Take 1 tablet by mouth daily.   04/28/2018 at Unknown time    Review of Systems  Eyes: Negative for visual disturbance.  Gastrointestinal: Negative for abdominal pain.  Genitourinary: Negative for vaginal bleeding and vaginal discharge.  Neurological: Negative for headaches.   Physical Exam   Blood pressure 131/85, pulse 84, weight 97.5 kg, last menstrual period 08/01/2017, SpO2 96 %, unknown if currently breastfeeding. Patient Vitals for the past 24 hrs:  BP Pulse SpO2 Weight  04/29/18 1730 131/85 84  96 % -  04/29/18 1715 138/88 86 98 % -  04/29/18 1700 (!) 139/97 87 97 % -  04/29/18 1645 (!) 150/96 97 99 % -  04/29/18 1630 (!) 134/99 87 97 % -  04/29/18 1615 (!) 141/94 89 97 % -  04/29/18 1600 (!) 139/94 90 96 % -  04/29/18 1545 (!) 138/99 94 96 % -  04/29/18 1540 (!) 146/93 (!) 107 94 % -  04/29/18 1531 - - - 97.5 kg    Physical Exam  Constitutional: She is oriented to person, place, and time. She appears well-developed and well-nourished. No distress.  HENT:  Head: Normocephalic and atraumatic.  Neck: Normal range of motion.  Respiratory: Effort normal. No respiratory distress.  Musculoskeletal: Normal range of motion.  Neurological: She is alert and oriented to person, place, and time.  Psychiatric: She has a normal mood and affect.  EFM: 120 bpm, mod variability, + accels, no decels Toco: irritability  Results for orders placed or performed during the hospital encounter of 04/29/18 (from the past 24 hour(s))  Urinalysis, Routine w reflex microscopic     Status: Abnormal   Collection Time: 04/29/18  3:11 PM  Result Value Ref Range   Color, Urine AMBER (A) YELLOW   APPearance CLOUDY (A) CLEAR   Specific Gravity, Urine 1.040 (H) 1.005 - 1.030   pH 5.0 5.0 - 8.0   Glucose, UA NEGATIVE NEGATIVE mg/dL   Hgb urine dipstick NEGATIVE NEGATIVE   Bilirubin Urine NEGATIVE NEGATIVE   Ketones, ur 5 (A) NEGATIVE mg/dL   Protein, ur 161 (A) NEGATIVE mg/dL   Nitrite NEGATIVE NEGATIVE   Leukocytes, UA LARGE (A) NEGATIVE   RBC / HPF 0-5 0 - 5 RBC/hpf   WBC, UA 11-20 0 - 5 WBC/hpf   Bacteria, UA RARE (A) NONE SEEN   Squamous Epithelial / LPF 11-20 0 - 5   Mucus PRESENT   Protein / creatinine ratio, urine     Status: None   Collection Time: 04/29/18  3:11 PM  Result Value Ref Range   Creatinine, Urine 468.00 mg/dL   Total Protein, Urine 34 mg/dL   Protein Creatinine Ratio 0.07 0.00 - 0.15 mg/mg[Cre]  CBC     Status: Abnormal   Collection Time: 04/29/18  4:05 PM  Result Value  Ref Range   WBC 6.4 4.0 - 10.5 K/uL   RBC 3.63 (L) 3.87 - 5.11 MIL/uL   Hemoglobin 9.4 (L) 12.0 - 15.0 g/dL   HCT 09.6 (L) 04.5 - 40.9 %   MCV 85.7 80.0 - 100.0 fL   MCH 25.9 (L) 26.0 - 34.0 pg   MCHC 30.2 30.0 - 36.0 g/dL   RDW 81.1 (H) 91.4 - 78.2 %   Platelets 152 150 - 400 K/uL   nRBC 0.0 0.0 - 0.2 %  Comprehensive metabolic panel     Status: Abnormal   Collection Time: 04/29/18  4:05 PM  Result Value Ref Range   Sodium 134 (L) 135 - 145 mmol/L   Potassium 3.7 3.5 - 5.1 mmol/L   Chloride 104 98 - 111 mmol/L   CO2 21 (L) 22 - 32 mmol/L   Glucose, Bld 83 70 - 99 mg/dL   BUN 7 6 - 20 mg/dL   Creatinine, Ser 9.56 0.44 - 1.00 mg/dL   Calcium 8.6 (L) 8.9 - 10.3 mg/dL   Total Protein 6.6 6.5 - 8.1 g/dL   Albumin 2.7 (L) 3.5 - 5.0 g/dL   AST 25 15 - 41 U/L   ALT 15 0 - 44 U/L   Alkaline Phosphatase 135 (H) 38 - 126 U/L   Total Bilirubin 0.5 0.3 - 1.2 mg/dL   GFR calc non Af Amer >60 >60 mL/min   GFR calc Af Amer >60 >60 mL/min   Anion gap 9 5 - 15   MAU Course  Procedures  MDM Labs ordered and reviewed. No evidence of PEC. No severe BPs. Consult with Dr. Macon Large, plan for BP tomorrow in office. Stable for discharge home.  Assessment and Plan   1. [redacted] weeks gestation of pregnancy   2. Transient hypertension of pregnancy in third trimester   3. NST (non-stress test) reactive    Discharge home Follow up at [facility] tomorrow at 2:00 pm PEC precautions  Allergies as of 04/29/2018   No Known Allergies     Medication List    TAKE these medications   acetaminophen 500 MG tablet Commonly known as:  TYLENOL Take 500 mg by mouth every 6 (six) hours as needed for mild pain.   prenatal multivitamin Tabs tablet Take 1 tablet by mouth daily.      Donette Larry, CNM 04/29/2018, 5:43 PM

## 2018-04-29 NOTE — MAU Note (Signed)
[facility] and had BP of 140s/90s.  Denies HA or visual changes.  Denies hx of HTN with previous pregnancy.  Reports good fetal Movement.

## 2018-04-29 NOTE — Discharge Instructions (Signed)
Preeclampsia and Eclampsia °Preeclampsia is a serious condition that develops only during pregnancy. It is also called toxemia of pregnancy. This condition causes high blood pressure along with other symptoms, such as swelling and headaches. These symptoms may develop as the condition gets worse. Preeclampsia may occur at 20 weeks of pregnancy or later. °Diagnosing and treating preeclampsia early is very important. If not treated early, it can cause serious problems for you and your baby. One problem it can lead to is eclampsia, which is a condition that causes muscle jerking or shaking (convulsions or seizures) in the mother. Delivering your baby is the best treatment for preeclampsia or eclampsia. Preeclampsia and eclampsia symptoms usually go away after your baby is born. °What are the causes? °The cause of preeclampsia is not known. °What increases the risk? °The following risk factors make you more likely to develop preeclampsia: °· Being pregnant for the first time. °· Having had preeclampsia during a past pregnancy. °· Having a family history of preeclampsia. °· Having high blood pressure. °· Being pregnant with twins or triplets. °· Being 35 or older. °· Being African-American. °· Having kidney disease or diabetes. °· Having medical conditions such as lupus or blood diseases. °· Being very overweight (obese). ° °What are the signs or symptoms? °The earliest signs of preeclampsia are: °· High blood pressure. °· Increased protein in your urine. Your health care provider will check for this at every visit before you give birth (prenatal visit). ° °Other symptoms that may develop as the condition gets worse include: °· Severe headaches. °· Sudden weight gain. °· Swelling of the hands, face, legs, and feet. °· Nausea and vomiting. °· Vision problems, such as blurred or double vision. °· Numbness in the face, arms, legs, and feet. °· Urinating less than usual. °· Dizziness. °· Slurred speech. °· Abdominal pain,  especially upper abdominal pain. °· Convulsions or seizures. ° °Symptoms generally go away after giving birth. °How is this diagnosed? °There are no screening tests for preeclampsia. Your health care provider will ask you about symptoms and check for signs of preeclampsia during your prenatal visits. You may also have tests that include: °· Urine tests. °· Blood tests. °· Checking your blood pressure. °· Monitoring your baby’s heart rate. °· Ultrasound. ° °How is this treated? °You and your health care provider will determine the treatment approach that is best for you. Treatment may include: °· Having more frequent prenatal exams to check for signs of preeclampsia, if you have an increased risk for preeclampsia. °· Bed rest. °· Reducing how much salt (sodium) you eat. °· Medicine to lower your blood pressure. °· Staying in the hospital, if your condition is severe. There, treatment will focus on controlling your blood pressure and the amount of fluids in your body (fluid retention). °· You may need to take medicine (magnesium sulfate) to prevent seizures. This medicine may be given as an injection or through an IV tube. °· Delivering your baby early, if your condition gets worse. You may have your labor started with medicine (induced), or you may have a cesarean delivery. ° °Follow these instructions at home: °Eating and drinking ° °· Drink enough fluid to keep your urine clear or pale yellow. °· Eat a healthy diet that is low in sodium. Do not add salt to your food. Check nutrition labels to see how much sodium a food or beverage contains. °· Avoid caffeine. °Lifestyle °· Do not use any products that contain nicotine or tobacco, such as cigarettes   and e-cigarettes. If you need help quitting, ask your health care provider. °· Do not use alcohol or drugs. °· Avoid stress as much as possible. Rest and get plenty of sleep. °General instructions °· Take over-the-counter and prescription medicines only as told by your  health care provider. °· When lying down, lie on your side. This keeps pressure off of your baby. °· When sitting or lying down, raise (elevate) your feet. Try putting some pillows underneath your lower legs. °· Exercise regularly. Ask your health care provider what kinds of exercise are best for you. °· Keep all follow-up and prenatal visits as told by your health care provider. This is important. °How is this prevented? °To prevent preeclampsia or eclampsia from developing during another pregnancy: °· Get proper medical care during pregnancy. Your health care provider may be able to prevent preeclampsia or diagnose and treat it early. °· Your health care provider may have you take a low-dose aspirin or a calcium supplement during your next pregnancy. °· You may have tests of your blood pressure and kidney function after giving birth. °· Maintain a healthy weight. Ask your health care provider for help managing weight gain during pregnancy. °· Work with your health care provider to manage any long-term (chronic) health conditions you have, such as diabetes or kidney problems. ° °Contact a health care provider if: °· You gain more weight than expected. °· You have headaches. °· You have nausea or vomiting. °· You have abdominal pain. °· You feel dizzy or light-headed. °Get help right away if: °· You develop sudden or severe swelling anywhere in your body. This usually happens in the legs. °· You gain 5 lbs (2.3 kg) or more during one week. °· You have severe: °? Abdominal pain. °? Headaches. °? Dizziness. °? Vision problems. °? Confusion. °? Nausea or vomiting. °· You have a seizure. °· You have trouble moving any part of your body. °· You develop numbness in any part of your body. °· You have trouble speaking. °· You have any abnormal bleeding. °· You pass out. °This information is not intended to replace advice given to you by your health care provider. Make sure you discuss any questions you have with your health  care provider. °Document Released: 05/02/2000 Document Revised: 01/01/2016 Document Reviewed: 12/10/2015 °Elsevier Interactive Patient Education © 2018 [employer] Inc[employer] ° °Fetal Movement Counts °Patient Name: ________________________________________________ Patient Due Date: ____________________ °What is a fetal movement count? °A fetal movement count is the number of times that you feel your baby move during a certain amount of time. This may also be called a fetal kick count. A fetal movement count is recommended for every pregnant woman. You may be asked to start counting fetal movements as early as week 28 of your pregnancy. °Pay attention to when your baby is most active. You may notice your baby's sleep and wake cycles. You may also notice things that make your baby move more. You should do a fetal movement count: °· When your baby is normally most active. °· At the same time each day. ° °A good time to count movements is while you are resting, after having something to eat and drink. °How do I count fetal movements? °1. Find a quiet, comfortable area. Sit, or lie down on your side. °2. Write down the date, the start time and stop time, and the number of movements that you felt between those two times. Take this information with you to your health care visits. °3. For   hours, count kicks, flutters, swishes, rolls, and jabs. You should feel at least 10 movements during 2 hours. 4. You may stop counting after you have felt 10 movements. 5. If you do not feel 10 movements in 2 hours, have something to eat and drink. Then, keep resting and counting for 1 hour. If you feel at least 4 movements during that hour, you may stop counting. Contact a health care provider if:  You feel fewer than 4 movements in 2 hours.  Your baby is not moving like he or she usually does. Date: ____________ Start time: ____________ Stop time: ____________ Movements: ____________ Date: ____________ Start time: ____________ Stop time:  ____________ Movements: ____________ Date: ____________ Start time: ____________ Stop time: ____________ Movements: ____________ Date: ____________ Start time: ____________ Stop time: ____________ Movements: ____________ Date: ____________ Start time: ____________ Stop time: ____________ Movements: ____________ Date: ____________ Start time: ____________ Stop time: ____________ Movements: ____________ Date: ____________ Start time: ____________ Stop time: ____________ Movements: ____________ Date: ____________ Start time: ____________ Stop time: ____________ Movements: ____________ Date: ____________ Start time: ____________ Stop time: ____________ Movements: ____________ This information is not intended to replace advice given to you by your health care provider. Make sure you discuss any questions you have with your health care provider. Document Released: 06/04/2006 Document Revised: 01/02/2016 Document Reviewed: 06/14/2015 Elsevier Interactive Patient Education  2018 [employer] Hypertension During Pregnancy Hypertension is also called high blood pressure. High blood pressure means that the force of your blood moving in your body is too strong. When you are pregnant, this condition should be watched carefully. It can cause problems for you and your baby. Follow these instructions at home: Eating and drinking  Drink enough fluid to keep your pee (urine) clear or pale yellow.  Eat healthy foods that are low in salt (sodium). ? Do not add salt to your food. ? Check labels on foods and drinks to see much salt is in them. Look on the label where you see "Sodium." Lifestyle  Do not use any products that contain nicotine or tobacco, such as cigarettes and e-cigarettes. If you need help quitting, ask your doctor.  Do not use alcohol.  Avoid caffeine.  Avoid stress. Rest and get plenty of sleep. General instructions  Take over-the-counter and prescription medicines only as told by your  doctor.  While lying down, lie on your left side. This keeps pressure off your baby.  While sitting or lying down, raise (elevate) your feet. Try putting some pillows under your lower legs.  Exercise regularly. Ask your doctor what kinds of exercise are best for you.  Keep all prenatal and follow-up visits as told by your doctor. This is important. Contact a doctor if:  You have symptoms that your doctor told you to watch for, such as: ? Fever. ? Throwing up (vomiting). ? Headache. Get help right away if:  You have very bad pain in your belly (abdomen).  You are throwing up, and this does not get better with treatment.  You suddenly get swelling in your hands, ankles, or face.  You gain 4 lb (1.8 kg) or more in 1 week.  You get bleeding from your vagina.  You have blood in your pee.  You do not feel your baby moving as much as normal.  You have a change in vision.  You have muscle twitching or sudden tightening (spasms).  You have trouble breathing.  Your lips or fingernails turn blue. This information is not intended to replace advice given to  you by your health care provider. Make sure you discuss any questions you have with your health care provider. Document Released: 06/07/2010 Document Revised: 01/15/2016 Document Reviewed: 01/15/2016 Elsevier Interactive Patient Education  [employer].

## 2018-05-01 ENCOUNTER — Inpatient Hospital Stay (HOSPITAL_COMMUNITY): Payer: Medicaid Other | Admitting: Anesthesiology

## 2018-05-01 ENCOUNTER — Encounter (HOSPITAL_COMMUNITY): Payer: Self-pay

## 2018-05-01 ENCOUNTER — Other Ambulatory Visit: Payer: Self-pay

## 2018-05-01 ENCOUNTER — Inpatient Hospital Stay (HOSPITAL_COMMUNITY)
Admission: AD | Admit: 2018-05-01 | Discharge: 2018-05-03 | DRG: 807 | Disposition: A | Discharge status: Home / Self Care | Payer: Medicaid Other | Attending: Obstetrics | Admitting: Obstetrics

## 2018-05-01 DIAGNOSIS — Z3A39 39 weeks gestation of pregnancy: Secondary | ICD-10-CM

## 2018-05-01 DIAGNOSIS — O9902 Anemia complicating childbirth: Secondary | ICD-10-CM | POA: Diagnosis present

## 2018-05-01 DIAGNOSIS — Z3483 Encounter for supervision of other normal pregnancy, third trimester: Secondary | ICD-10-CM | POA: Diagnosis present

## 2018-05-01 DIAGNOSIS — Z87891 Personal history of nicotine dependence: Secondary | ICD-10-CM | POA: Diagnosis not present

## 2018-05-01 DIAGNOSIS — D509 Iron deficiency anemia, unspecified: Secondary | ICD-10-CM | POA: Diagnosis present

## 2018-05-01 DIAGNOSIS — O134 Gestational [pregnancy-induced] hypertension without significant proteinuria, complicating childbirth: Secondary | ICD-10-CM | POA: Diagnosis present

## 2018-05-01 LAB — COMPREHENSIVE METABOLIC PANEL
ALT: 15 U/L (ref 0–44)
AST: 23 U/L (ref 15–41)
Albumin: 2.6 g/dL — ABNORMAL LOW (ref 3.5–5.0)
Alkaline Phosphatase: 130 U/L — ABNORMAL HIGH (ref 38–126)
Anion gap: 8 (ref 5–15)
BUN: 7 mg/dL (ref 6–20)
CHLORIDE: 104 mmol/L (ref 98–111)
CO2: 20 mmol/L — ABNORMAL LOW (ref 22–32)
CREATININE: 0.5 mg/dL (ref 0.44–1.00)
Calcium: 8.8 mg/dL — ABNORMAL LOW (ref 8.9–10.3)
GFR calc Af Amer: >60 mL/min (ref >60)
GFR calc non Af Amer: >60 mL/min (ref >60)
Glucose, Bld: 81 mg/dL (ref 70–99)
POTASSIUM: 3.5 mmol/L (ref 3.5–5.1)
Sodium: 132 mmol/L — ABNORMAL LOW (ref 135–145)
Total Bilirubin: 0.6 mg/dL (ref 0.3–1.2)
Total Protein: 6.4 g/dL — ABNORMAL LOW (ref 6.5–8.1)

## 2018-05-01 LAB — TYPE AND SCREEN
ABO/RH(D): A POS
ANTIBODY SCREEN: NEGATIVE

## 2018-05-01 LAB — CBC
HCT: 31 % — ABNORMAL LOW (ref 36.0–46.0)
Hemoglobin: 9.6 g/dL — ABNORMAL LOW (ref 12.0–15.0)
MCH: 26.2 pg (ref 26.0–34.0)
MCHC: 31 g/dL (ref 30.0–36.0)
MCV: 84.7 fL (ref 80.0–100.0)
NRBC: 0 % (ref 0.0–0.2)
Platelets: 157 10*3/uL (ref 150–400)
RBC: 3.66 MIL/uL — ABNORMAL LOW (ref 3.87–5.11)
RDW: 16.4 % — ABNORMAL HIGH (ref 11.5–15.5)
WBC: 8.5 10*3/uL (ref 4.0–10.5)

## 2018-05-01 LAB — ABO/RH: ABO/RH(D): A POS

## 2018-05-01 MED ORDER — LIDOCAINE HCL (PF) 1 % IJ SOLN
30.0000 mL | INTRAMUSCULAR | Status: DC | PRN
  Filled 2018-05-01: qty 30

## 2018-05-01 MED ORDER — EPHEDRINE 5 MG/ML INJ
10.0000 mg | INTRAVENOUS | Status: DC | PRN
  Filled 2018-05-01: qty 2

## 2018-05-01 MED ORDER — PHENYLEPHRINE 40 MCG/ML (10ML) SYRINGE FOR IV PUSH (FOR BLOOD PRESSURE SUPPORT)
80.0000 ug | PREFILLED_SYRINGE | INTRAVENOUS | Status: DC | PRN
  Filled 2018-05-01 (×2): qty 10

## 2018-05-01 MED ORDER — PRENATAL MULTIVITAMIN CH
1.0000 | ORAL_TABLET | Freq: Every day | ORAL | Status: DC
  Administered 2018-05-02 – 2018-05-03 (×2): 1 via ORAL
  Filled 2018-05-01 (×2): qty 1

## 2018-05-01 MED ORDER — IBUPROFEN 600 MG PO TABS
600.0000 mg | ORAL_TABLET | Freq: Four times a day (QID) | ORAL | Status: DC
  Administered 2018-05-01 – 2018-05-03 (×8): 600 mg via ORAL
  Filled 2018-05-01 (×8): qty 1

## 2018-05-01 MED ORDER — OXYCODONE-ACETAMINOPHEN 5-325 MG PO TABS: 1.0000 | ORAL_TABLET | ORAL | Status: DC | PRN

## 2018-05-01 MED ORDER — FENTANYL CITRATE (PF) 100 MCG/2ML IJ SOLN: 50.0000 ug | INTRAMUSCULAR | Status: DC | PRN

## 2018-05-01 MED ORDER — OXYTOCIN BOLUS FROM INFUSION
500.0000 mL | Freq: Once | INTRAVENOUS | Status: AC
  Administered 2018-05-01: 500 mL via INTRAVENOUS

## 2018-05-01 MED ORDER — SIMETHICONE 80 MG PO CHEW: 80.0000 mg | CHEWABLE_TABLET | ORAL | Status: DC | PRN

## 2018-05-01 MED ORDER — ACETAMINOPHEN 325 MG PO TABS
650.0000 mg | ORAL_TABLET | ORAL | Status: DC | PRN
  Administered 2018-05-02 – 2018-05-03 (×2): 650 mg via ORAL
  Filled 2018-05-01 (×2): qty 2

## 2018-05-01 MED ORDER — OXYTOCIN 40 UNITS IN LACTATED RINGERS INFUSION - SIMPLE MED
2.5000 [IU]/h | INTRAVENOUS | Status: DC
  Filled 2018-05-01: qty 1000

## 2018-05-01 MED ORDER — ONDANSETRON HCL 4 MG PO TABS: 4.0000 mg | ORAL_TABLET | ORAL | Status: DC | PRN

## 2018-05-01 MED ORDER — COCONUT OIL OIL: 1.0000 "application " | TOPICAL_OIL | Status: DC | PRN

## 2018-05-01 MED ORDER — SOD CITRATE-CITRIC ACID 500-334 MG/5ML PO SOLN
30.0000 mL | ORAL | Status: DC | PRN
  Administered 2018-05-01: 30 mL via ORAL
  Filled 2018-05-01: qty 15

## 2018-05-01 MED ORDER — PHENYLEPHRINE 40 MCG/ML (10ML) SYRINGE FOR IV PUSH (FOR BLOOD PRESSURE SUPPORT)
80.0000 ug | PREFILLED_SYRINGE | INTRAVENOUS | Status: DC | PRN
  Filled 2018-05-01: qty 10

## 2018-05-01 MED ORDER — OXYCODONE HCL 5 MG PO TABS
5.0000 mg | ORAL_TABLET | ORAL | Status: DC | PRN
  Administered 2018-05-02 – 2018-05-03 (×2): 5 mg via ORAL
  Filled 2018-05-01 (×2): qty 1

## 2018-05-01 MED ORDER — DIPHENHYDRAMINE HCL 25 MG PO CAPS: 25.0000 mg | ORAL_CAPSULE | Freq: Four times a day (QID) | ORAL | Status: DC | PRN

## 2018-05-01 MED ORDER — SENNOSIDES-DOCUSATE SODIUM 8.6-50 MG PO TABS
2.0000 | ORAL_TABLET | ORAL | Status: DC
  Administered 2018-05-01 – 2018-05-02 (×2): 2 via ORAL
  Filled 2018-05-01 (×2): qty 2

## 2018-05-01 MED ORDER — BENZOCAINE-MENTHOL 20-0.5 % EX AERO: 1.0000 "application " | INHALATION_SPRAY | CUTANEOUS | Status: DC | PRN

## 2018-05-01 MED ORDER — OXYCODONE-ACETAMINOPHEN 5-325 MG PO TABS: 2.0000 | ORAL_TABLET | ORAL | Status: DC | PRN

## 2018-05-01 MED ORDER — ONDANSETRON HCL 4 MG/2ML IJ SOLN: 4.0000 mg | Freq: Four times a day (QID) | INTRAMUSCULAR | Status: DC | PRN

## 2018-05-01 MED ORDER — LIDOCAINE HCL (PF) 1 % IJ SOLN
INTRAMUSCULAR | Status: DC | PRN
  Administered 2018-05-01: 8 mL via EPIDURAL

## 2018-05-01 MED ORDER — ONDANSETRON HCL 4 MG/2ML IJ SOLN: 4.0000 mg | INTRAMUSCULAR | Status: DC | PRN

## 2018-05-01 MED ORDER — DIPHENHYDRAMINE HCL 50 MG/ML IJ SOLN: 12.5000 mg | INTRAMUSCULAR | Status: DC | PRN

## 2018-05-01 MED ORDER — OXYCODONE HCL 5 MG PO TABS: 10.0000 mg | ORAL_TABLET | ORAL | Status: DC | PRN

## 2018-05-01 MED ORDER — LACTATED RINGERS IV SOLN
500.0000 mL | Freq: Once | INTRAVENOUS | Status: AC
  Administered 2018-05-01: 500 mL via INTRAVENOUS

## 2018-05-01 MED ORDER — TETANUS-DIPHTH-ACELL PERTUSSIS 5-2.5-18.5 LF-MCG/0.5 IM SUSP: 0.5000 mL | Freq: Once | INTRAMUSCULAR | Status: DC

## 2018-05-01 MED ORDER — ACETAMINOPHEN 325 MG PO TABS
650.0000 mg | ORAL_TABLET | ORAL | Status: DC | PRN
  Administered 2018-05-01: 650 mg via ORAL
  Filled 2018-05-01: qty 2

## 2018-05-01 MED ORDER — LACTATED RINGERS IV SOLN: 500.0000 mL | INTRAVENOUS | Status: DC | PRN

## 2018-05-01 MED ORDER — WITCH HAZEL-GLYCERIN EX PADS: 1.0000 "application " | MEDICATED_PAD | CUTANEOUS | Status: DC | PRN

## 2018-05-01 MED ORDER — DIBUCAINE 1 % RE OINT: 1.0000 "application " | TOPICAL_OINTMENT | RECTAL | Status: DC | PRN

## 2018-05-01 MED ORDER — LACTATED RINGERS IV SOLN
INTRAVENOUS | Status: DC
  Administered 2018-05-01 (×3): via INTRAVENOUS

## 2018-05-01 MED ORDER — FENTANYL 2.5 MCG/ML BUPIVACAINE 1/10 % EPIDURAL INFUSION (WH - ANES)
14.0000 mL/h | INTRAMUSCULAR | Status: DC | PRN
  Administered 2018-05-01: 14 mL/h via EPIDURAL
  Filled 2018-05-01: qty 100

## 2018-05-01 NOTE — Anesthesia Postprocedure Evaluation (Signed)
Anesthesia Post Note  Patient: Katherine Ayers  Procedure(s) Performed: AN AD HOC LABOR EPIDURAL     Patient location during evaluation: Mother Baby Anesthesia Type: Epidural Level of consciousness: awake and alert and oriented Pain management: satisfactory to patient Vital Signs Assessment: post-procedure vital signs reviewed and stable Respiratory status: respiratory function stable Cardiovascular status: stable Postop Assessment: no headache, no backache, epidural receding, patient able to bend at knees, no signs of nausea or vomiting and adequate PO intake Anesthetic complications: no    Last Vitals:  Vitals:   05/01/18 1317 05/01/18 1340  BP: (!) 145/88 139/90  Pulse: 70 63  Resp:  18  Temp:  36.7 C  SpO2:      Last Pain:  Vitals:   05/01/18 1440  TempSrc:   PainSc: 3    Pain Goal: Patients Stated Pain Goal: 4 (05/01/18 0911)               Karleen DolphinFUSSELL,Herold Salguero

## 2018-05-01 NOTE — Anesthesia Procedure Notes (Signed)
Epidural Patient location during procedure: OB Start time: 05/01/2018 9:44 AM  Staffing Anesthesiologist: Bethena Midgetddono, Tykira Wachs, MD  Preanesthetic Checklist Completed: patient identified, site marked, surgical consent, pre-op evaluation, timeout performed, IV checked, risks and benefits discussed and monitors and equipment checked  Epidural Patient position: sitting Prep: site prepped and draped and DuraPrep Patient monitoring: continuous pulse ox and blood pressure Approach: midline Location: L3-L4 Injection technique: LOR air  Needle:  Needle type: Tuohy  Needle gauge: 17 G Needle length: 9 cm and 9 Needle insertion depth: 6 cm Catheter type: closed end flexible Catheter size: 19 Gauge Catheter at skin depth: 10 cm Test dose: negative  Assessment Events: blood not aspirated, injection not painful, no injection resistance, negative IV test and no paresthesia

## 2018-05-01 NOTE — Anesthesia Postprocedure Evaluation (Signed)
Anesthesia Post Note  Patient: Marnette Burgessmanda D Garramone  Procedure(s) Performed: AN AD HOC LABOR EPIDURAL     Patient location during evaluation: Mother Baby Anesthesia Type: Epidural Level of consciousness: awake and alert Pain management: pain level controlled Vital Signs Assessment: post-procedure vital signs reviewed and stable Respiratory status: spontaneous breathing, nonlabored ventilation and respiratory function stable Cardiovascular status: stable Postop Assessment: no headache, no backache and epidural receding Anesthetic complications: no    Last Vitals:  Vitals:   05/01/18 1317 05/01/18 1340  BP: (!) 145/88 139/90  Pulse: 70 63  Resp:  18  Temp:  36.7 C  SpO2:      Last Pain:  Vitals:   05/01/18 1440  TempSrc:   PainSc: 3    Pain Goal: Patients Stated Pain Goal: 4 (05/01/18 0911)               Allicia Culley

## 2018-05-01 NOTE — MAU Note (Signed)
Urine in lab 

## 2018-05-01 NOTE — MAU Note (Signed)
Katherine Ayers is a 27 y.o. at 6448w0d here in MAU reporting: +contractions; 3-5 minutes apart.  Onset of complaint: around midnight Pain score: 6-7/10 Denies LOF/VB Vitals:   05/01/18 0729  BP: (!) 146/89  Pulse: 89  Resp: 18  Temp: 97.7 F (36.5 C)  SpO2: 100%    Patient states she is being watched for her BP but not currently on any meds.  Endorses being 3cm on Thursday in the office at [facility] FHT:120; +FM Lab orders placed from triage: none

## 2018-05-01 NOTE — H&P (Signed)
27 y.o. [id number] @ 7184w0d presents with painful contractions.  In MAU, she was found to be in labor.  Otherwise has good fetal movement and no bleeding.  She was also noted to have elevated blood pressures.  She was seen in the office 2 days ago for a routine OB visit and had BP of 140/90.  In MAU, she was ruled out for gestational hypertension.  Upon admission today, BPs were again elevated 130-140s /80-90s.  She reports a headache this morning that resolved without treatment.  Is otherwise asymptomatic.    Of note, she was treated for UTI by MAU.  No UCx sent.  Review of UA shows a dirty catch.  Patient was asymptomatic.   Pregnancy c/b: 1. Anemia, iron deficiency: has been on BID iron 2. Late entry into prenatal care: dated by a 20 week US with EDD consistent with LMP  Past Medical History:  Diagnosis Date  . No pertinent past medical history     Past Surgical History:  Procedure Laterality Date  . DILATION AND CURETTAGE OF UTERUS      OB History  Gravida Para Term Preterm AB Living  4 1 1  0 2 1  SAB TAB Ectopic Multiple Live Births  0 2 0 0 1    # Outcome Date GA Lbr Len/2nd Weight Sex Delivery Anes PTL Lv  4 Current           3 Term 07/09/12 6960w0d 12:58 / 01:00 3100 g F Vag-Spont EPI    2 TAB           1 TAB             Social History   Socioeconomic History  . Marital status: Single    Spouse name: Not on file  . Number of children: Not on file  . Years of education: Not on file  . Highest education level: Not on file  Occupational History  . Not on file  Tobacco Use  . Smoking status: Former Smoker    Last attempt to quit: 10/28/2017    Years since quitting: 0.5  . Smokeless tobacco: Never Used  Substance and Sexual Activity  . Alcohol use: Not Currently    Comment: none in pregnancy  . Drug use: No  . Sexual activity: Yes    Birth control/protection: None   Patient has no known allergies.    Prenatal Transfer Tool  Maternal Diabetes: No Genetic Screening:  Normal Maternal Ultrasounds/Referrals: Normal Fetal Ultrasounds or other Referrals:  None Maternal Substance Abuse:  No Significant Maternal Medications:  None Significant Maternal Lab Results: Lab values include: Group B Strep negative  ABO, Rh: --/--/A POS (12/14 [id number]) Antibody: PENDING (12/14 0903) Rubella:  Immune RPR:   NR HBsAg:   Neg HIV:   Neg GBS:   Neg    Vitals:   05/01/18 0953 05/01/18 0956  BP: (!) 161/96 (!) 148/97  Pulse: 75 79  Resp:  16  Temp:    SpO2:       General:  NAD Abdomen:  soft, gravid, EFW 6.5# Ex:  no edema SVE:  7/90/-1, AROM scant fluid, brown tinged (unclear if mec or spotting from cervical change) FHTs:  110s, moderate variability, + accelerations, Cat 1 tracing Toco:  q3-4 minutes   A/P   [age] y.o. [id number] 5784w0d presents with labor Comfortable with epidural Gestational hypertension--- on severe range pressure since admission that was normal on repeat 3 minutes later.  Otherwise, mild range and asymptomatic.  will monitor closely for s/sx severe preeclampsia.  Anticipate vaginal delivery FSR/ vtx/ GBS neg  Farmer Mccahill GEFFEL Aayan Haskew

## 2018-05-01 NOTE — Addendum Note (Signed)
Addendum  created 05/01/18 1826 by Graciela HusbandsFussell, Andrej Spagnoli O, CRNA   Charge Capture section accepted, Clinical Note Signed

## 2018-05-01 NOTE — Anesthesia Preprocedure Evaluation (Signed)
Anesthesia Evaluation  Patient identified by MRN, date of birth, ID band Patient awake    Reviewed: Allergy & Precautions, H&P , NPO status , Patient's Chart, lab work & pertinent test results, reviewed documented beta blocker date and time   Airway Mallampati: II  TM Distance: >3 FB Neck ROM: full    Dental no notable dental hx.    Pulmonary neg pulmonary ROS, former smoker,    Pulmonary exam normal breath sounds clear to auscultation       Cardiovascular negative cardio ROS Normal cardiovascular exam Rhythm:regular Rate:Normal     Neuro/Psych negative neurological ROS  negative psych ROS   GI/Hepatic negative GI ROS, Neg liver ROS,   Endo/Other  negative endocrine ROS  Renal/GU negative Renal ROS  negative genitourinary   Musculoskeletal   Abdominal   Peds  Hematology negative hematology ROS (+)   Anesthesia Other Findings   Reproductive/Obstetrics (+) Pregnancy                             Anesthesia Physical Anesthesia Plan  ASA: II  Anesthesia Plan: Epidural   Post-op Pain Management:    Induction:   PONV Risk Score and Plan: 2 and Treatment may vary due to age or medical condition  Airway Management Planned:   Additional Equipment:   Intra-op Plan:   Post-operative Plan:   Informed Consent: I have reviewed the patients History and Physical, chart, labs and discussed the procedure including the risks, benefits and alternatives for the proposed anesthesia with the patient or authorized representative who has indicated his/her understanding and acceptance.     Plan Discussed with: Anesthesiologist, CRNA and Surgeon  Anesthesia Plan Comments:         Anesthesia Quick Evaluation

## 2018-05-02 LAB — CBC
HEMATOCRIT: 30.7 % — AB (ref 36.0–46.0)
Hemoglobin: 9.6 g/dL — ABNORMAL LOW (ref 12.0–15.0)
MCH: 26.9 pg (ref 26.0–34.0)
MCHC: 31.3 g/dL (ref 30.0–36.0)
MCV: 86 fL (ref 80.0–100.0)
Platelets: 174 10*3/uL (ref 150–400)
RBC: 3.57 MIL/uL — ABNORMAL LOW (ref 3.87–5.11)
RDW: 16.5 % — ABNORMAL HIGH (ref 11.5–15.5)
WBC: 11.3 10*3/uL — ABNORMAL HIGH (ref 4.0–10.5)
nRBC: 0 % (ref 0.0–0.2)

## 2018-05-02 LAB — RPR: RPR Ser Ql: NONREACTIVE

## 2018-05-02 MED ORDER — NIFEDIPINE ER OSMOTIC RELEASE 30 MG PO TB24
30.0000 mg | ORAL_TABLET | Freq: Every day | ORAL | Status: DC
  Administered 2018-05-02 – 2018-05-03 (×2): 30 mg via ORAL
  Filled 2018-05-02 (×2): qty 1

## 2018-05-02 NOTE — Progress Notes (Signed)
Patient is doing well.  She is ambulating, voiding, tolerating PO.  Pain control is good.  Lochia is appropriate.  BPs have been 120s-130s/80-90s since delivery.  Is asymptomatic.   Vitals:   05/01/18 1840 05/01/18 2256 05/02/18 0330 05/02/18 0813  BP: 138/84 (!) 135/99 (!) 128/92 126/84  Pulse: 80 72 83 79  Resp: 18 18 16 18   Temp: 98.6 F (37 C) 97.6 F (36.4 C) 98 F (36.7 C) 98.1 F (36.7 C)  TempSrc: Oral Oral Oral Oral  SpO2:      Weight:      Height:        NAD Fundus firm Ext: trace edema bilaterally  Lab Results  Component Value Date   WBC 11.3 (H) 05/02/2018   HGB 9.6 (L) 05/02/2018   HCT 30.7 (L) 05/02/2018   MCV 86.0 05/02/2018   PLT 174 05/02/2018    --/--/A POS, A POS Performed at [facility], [address] [place], [place] [address]  (12/14 0903)/RImmune  A/P 27 y.o. [id number] PPD#1. Routine care.   GHTN-- diastolics mostly 90s since delivery, but most recent 126/84.  Will monitor close.  If persistely >140 or >90 will start PO anti-hypertensive.   Desires circumcision, but unsure if it will be performed inpatient or outpatient at this time  [facility] GEFFEL [facility]

## 2018-05-02 NOTE — Lactation Note (Signed)
This note was copied from a baby's chart. Lactation Consultation Note  Patient Name: Katherine Ayers ZOXWR'UToday's Date: 05/02/2018 Reason for consult: Initial assessment;Term;Infant weight loss  27 hours old FT female who is being exclusively BF by his mother, she's a P2 and experienced BF. She was able to BF her last child for 12 months and already knows how to hand express. When LC did hand expression with mom colostrum was easily obtained, mom has large breast with large nipples and her tissue is very compressible. Mom doesn't have a pump at home, [facility]. Instructions, cleaning and storage were reviewed, as well as milk storage guidelines. LC used a # 27 flange for her hand pump, it seemed more appropriate.  Offered assistance with latch and mom agreed to wake baby up to feed. LC took baby STS to mom's left breast in cradle position (per her request) but baby did not latch, he fell asleep at the breast as soon as he touched mom's skin. LC tried holding baby again to get him to wake up, but as soon as LC placed baby again at mom's breast, he'd stop crying and fall asleep. LC tried to do some suck training but baby already asleep, an attempt was documented in Flowsheets. Asked mom to call for assistance when needed.  Feeding plan:  1. Encouraged mom to feed baby STS 8-12 times/24 hours or sooner if feeding cues are present. 2. Hand expression and spoon/finger feeding was also encouraged  BF brochure, BF resources and feeding diary were also reviewed. Mom reported all questions and concerns were answered, she's aware of LC services and will call PRN.  Maternal Data Formula Feeding for Exclusion: No Has patient been taught Hand Expression?: Yes Does the patient have breastfeeding experience prior to this delivery?: Yes  Feeding Feeding Type: (attempt)  Interventions Interventions: Breast feeding basics reviewed;Assisted with latch;Skin to skin;Breast  massage;Adjust position;Support pillows;Hand pump;Hand express;Breast compression  Lactation Tools Discussed/Used Tools: Pump Breast pump type: Manual WIC Program: Yes Pump Review: Setup, frequency, and cleaning;Milk Storage Initiated by:: MPeck Date initiated:: 05/02/18   Consult Status Consult Status: Follow-up Date: 05/03/18 Follow-up type: In-patient    Reichen Hutzler Venetia ConstableS Shya Kovatch 05/02/2018, 3:28 PM

## 2018-05-02 NOTE — Progress Notes (Signed)
Diastolic BPs consistently >90 since delivery yesterday.  Will start procardia XL 30mg  daily.

## 2018-05-03 MED ORDER — IBUPROFEN 600 MG PO TABS: 600.0000 mg | ORAL_TABLET | Freq: Four times a day (QID) | ORAL | 0 refills | Status: DC | PRN

## 2018-05-03 MED ORDER — DOCUSATE SODIUM 100 MG PO CAPS: 100.0000 mg | ORAL_CAPSULE | Freq: Two times a day (BID) | ORAL | 0 refills | Status: DC

## 2018-05-03 NOTE — Lactation Note (Signed)
This note was copied from a baby's chart. Lactation Consultation Note  Patient Name: Katherine Ayers ZOXWR'UToClance Bollday's Date: 05/03/2018 Reason for consult: Follow-up assessment;Term;Infant weight loss(8% weight loss, )  Baby is 45 hour old  LC reviewed and updated the doc flow sheets per mom.  Mom mentioned her right nipple is alittle sore , with her permission assessed the breast tissue  And noted no breakdown, compressible areolas and ease to hand express and the colostrum just flows out. LC suspects that is why the baby has short feedings.  Mom mentioned she is using the cradle position on the left and and the same on the right.  LC recommended the cross cradle on the left and the football on the right to obtain the depth so  The baby consistently learns to latch with depth and to prevent soreness.  Baby asleep in crib and not able to assess latch at this consult.  Mom reports swallows and comfort on the left when feeding, initially on the right uncomfortable and  Then improves.  LC discussed the importance of STS feedings until the baby is back to birth weight, gaining steadily, and can stay awake for a feeding. Nutritive vs non - nutritive feeding patterns and watching for hanging out latched.  Reviewed sore nipple and engorgement prevention and treatment. Mom has hand pump with #27 F and LC instructed mom on the use of shells between feedings except when sleeping until the soreness improves. ' Mother informed of post-discharge support and given phone number to the lactation department, including services for phone call assistance; out-patient appointments; and breastfeeding support group. List of other breastfeeding resources in the community given in the handout. Encouraged mother to call for problems or concerns related to breastfeeding.   Maternal Data Has patient been taught Hand Expression?: Yes(LC reviewed ) Does the patient have breastfeeding experience prior to this delivery?:  Yes  Feeding Feeding Type: Breast Fed  LATCH Score                   Interventions Interventions: Breast feeding basics reviewed;Shells;Hand pump;Hand express  Lactation Tools Discussed/Used Tools: Shells;Pump Flange Size: 24;27 Shell Type: Inverted Breast pump type: Manual Pump Review: Setup, frequency, and cleaning;Milk Storage(LC reviewed ) Initiated by:: MAI / reviewed  Date initiated:: 05/03/18   Consult Status Consult Status: Complete    [name] 05/03/2018, 9:28 AM

## 2018-05-03 NOTE — Discharge Summary (Signed)
Obstetric Discharge Summary Reason for Admission: onset of labor Prenatal Procedures: ultrasound Intrapartum Procedures: spontaneous vaginal delivery Postpartum Procedures: none Complications-Operative and Postpartum: none Hemoglobin  Date Value Ref Range Status  05/02/2018 9.6 (L) 12.0 - 15.0 g/dL Final   HCT  Date Value Ref Range Status  05/02/2018 30.7 (L) 36.0 - 46.0 % Final    Physical Exam:  General: alert, cooperative and appears stated age 79Lochia: appropriate Uterine Fundus: firm DVT Evaluation: No evidence of DVT seen on physical exam.  Discharge Diagnoses: Term Pregnancy-delivered  Discharge Information: Date: 05/03/2018 Activity: pelvic rest Diet: routine Medications: Ibuprofen and Colace Condition: improved Instructions: refer to practice specific booklet Discharge to: home Follow-up Information    Marlow Baarslark, Dyanna, MD Follow up in 4 week(s).   Specialty:  Obstetrics Why:  for a postpartum evaluation Contact information: [address] [place] [address] [phone]           Newborn Data: Live born female  Birth Weight: 7 lb 12.5 oz (3530 g) APGAR: 8, 9  Newborn Delivery   Birth date/time:  05/01/2018 11:44:00 Delivery type:  Vaginal, Spontaneous     Home with mother.  Waynard ReedsKendra Ezabella Teska 05/03/2018, 9:52 AM

## 2022-06-03 ENCOUNTER — Other Ambulatory Visit (HOSPITAL_COMMUNITY)
Admission: RE | Admit: 2022-06-03 | Discharge: 2022-06-03 | Disposition: A | Discharge status: Home / Self Care | Payer: Medicaid Other | Source: Ambulatory Visit | Attending: Family Medicine | Admitting: Family Medicine

## 2022-06-03 ENCOUNTER — Ambulatory Visit (INDEPENDENT_AMBULATORY_CARE_PROVIDER_SITE_OTHER): Payer: Medicaid Other | Admitting: Student

## 2022-06-03 ENCOUNTER — Encounter: Payer: Self-pay | Admitting: Student

## 2022-06-03 VITALS — BP 133/98 | HR 92 | Ht 63.0 in | Wt 229.4 lb

## 2022-06-03 DIAGNOSIS — Z113 Encounter for screening for infections with a predominantly sexual mode of transmission: Secondary | ICD-10-CM | POA: Insufficient documentation

## 2022-06-03 LAB — POCT WET PREP (WET MOUNT)
Clue Cells Wet Prep Whiff POC: POSITIVE
Trichomonas Wet Prep HPF POC: ABSENT

## 2022-06-03 NOTE — Patient Instructions (Addendum)
It was great to see you today! Thank you for choosing [facility] for your primary care. Katherine Ayers was seen for new patient appt.  Today we addressed: I will let you know what your results are Please return in 1 month for contraception discussion   If you haven't already, sign up for My Chart to have easy access to your labs results, and communication with your primary care physician.  We are checking some labs today. If they are abnormal, I will call you. If they are normal, I will send you a MyChart message (if it is active) or a letter in the mail. If you do not hear about your labs in the next 2 weeks, please call the office. I recommend that you always bring your medications to each appointment as this makes it easy to ensure you are on the correct medications and helps [place] not miss refills when you need them. Call the clinic at [phone] if your symptoms worsen or you have any concerns.  You should return to our clinic Return in about 4 weeks (around 07/01/2022) for [facility] discussion . Please arrive 15 minutes before your appointment to ensure smooth check in process.  We appreciate your efforts in making this happen.  Thank you for allowing me to participate in your care, Erskine Emery, MD 06/03/2022, 3:23 PM PGY-2, [place]

## 2022-06-03 NOTE — Progress Notes (Signed)
   Subjective:  Patient ID: Katherine Ayers, female    DOB: [birth year], 32 y.o.   MRN: [id number]  CC: New Patient  HPI:  Katherine Ayers is a very pleasant 32 y.o. female who presents today to establish care.  Vaginal discharge: Present all day, and it does not stop. White discharge, sometimes malodorous.    Taken abx before but unsure of what kind. She does note that they were oral abx that helped with the discharge before but it has always resumed.  No itching or rash.   Requesting STI panel. Always uses a condom and would like to discuss BC.   PMHx: Past Medical History:  Diagnosis Date   No pertinent past medical history     Surgical Hx: Past Surgical History:  Procedure Laterality Date   DILATION AND CURETTAGE OF UTERUS      Family Hx: Family History  Problem Relation Age of Onset   Other Neg Hx     Social Hx: Current Social History   Who lives at home: home with children 06/05/2022  Transportation: Patient drives self 11/22/2374 Current Stressors: None  06/05/2022  Medications:  None   Preventative Screening Needs pap   Smoking status reviewed  ROS: pertinent noted in the HPI    Objective:  BP (!) 133/98   Pulse 92   Ht 5\' 3"  (1.6 m)   Wt 229 lb 6.4 oz (104.1 kg)   LMP 05/11/2022   SpO2 100%   BMI 40.64 kg/m  Vitals and nursing note reviewed  General: NAD, pleasant, able to participate in exam HEENT: normocephalic, TM's visualized bilaterally, no scleral icterus or conjunctival pallor, no nasal discharge, moist mucous membranes, good dentition without erythema or discharge noted in posterior oropharynx Neck: supple, non-tender, without lymphadenopathy Cardiac: RRR, S1 S2 present. normal heart sounds, no murmurs. Respiratory: CTAB, normal effort, No wheezes, rales or rhonchi Abdomen: Normoactive bowel sounds, non-tender, non-distended, no hepatosplenomegaly Extremities: no edema or cyanosis. Skin: warm and dry, no rashes noted Neuro: alert, no  obvious focal deficits Psych: Normal affect and mood GU: Clear discharge, otherwise nml with chaperone Alexis [place]:  Routine screening for STI (sexually transmitted infection) Reviewed labs and allergies, provided [facility] education in AVS, will check GC/Chlamydia, Trichomonas, RPR, HIV, and HepC, wet prep and will call patient with results. Discussed safe sex practices. Will schedule pap if indicated, performed at this visit. Patient's questions answered to their satisfaction.  Return in 1 month for [facility] discussion and to reassess BP as elevated today.   Orders Placed This Encounter  Procedures   HIV Antibody (routine testing w rflx)   RPR   Hepatitis C antibody   POCT Wet Prep [facility])   No orders of the defined types were placed in this encounter.  Return in about 4 weeks (around 07/01/2022) for [facility] discussion . Erskine Emery, MD 06/05/2022, 10:22 AM PGY-2, [place]

## 2022-06-04 LAB — RPR: RPR Ser Ql: NONREACTIVE

## 2022-06-04 LAB — HIV ANTIBODY (ROUTINE TESTING W REFLEX): HIV Screen 4th Generation wRfx: NONREACTIVE

## 2022-06-04 LAB — HEPATITIS C ANTIBODY: Hep C Virus Ab: NONREACTIVE

## 2022-06-05 ENCOUNTER — Encounter: Payer: Self-pay | Admitting: Student

## 2022-06-05 DIAGNOSIS — Z113 Encounter for screening for infections with a predominantly sexual mode of transmission: Secondary | ICD-10-CM | POA: Insufficient documentation

## 2022-06-05 DIAGNOSIS — Z0000 Encounter for general adult medical examination without abnormal findings: Secondary | ICD-10-CM | POA: Insufficient documentation

## 2022-06-05 NOTE — Assessment & Plan Note (Signed)
Reviewed labs and allergies, provided [facility] education in AVS, will check GC/Chlamydia, Trichomonas, RPR, HIV, and HepC, wet prep and will call patient with results. Discussed safe sex practices. Will schedule pap if indicated, performed at this visit. Patient's questions answered to their satisfaction.  Return in 1 month for [facility] discussion and to reassess BP as elevated today.

## 2022-06-06 ENCOUNTER — Other Ambulatory Visit: Payer: Self-pay | Admitting: Student

## 2022-06-06 DIAGNOSIS — Z113 Encounter for screening for infections with a predominantly sexual mode of transmission: Secondary | ICD-10-CM

## 2022-06-06 DIAGNOSIS — B9689 Other specified bacterial agents as the cause of diseases classified elsewhere: Secondary | ICD-10-CM

## 2022-06-06 LAB — CYTOLOGY - PAP
Adequacy: ABSENT
Chlamydia: NEGATIVE
Comment: NEGATIVE
Comment: NEGATIVE
Comment: NEGATIVE
Comment: NORMAL
Diagnosis: NEGATIVE
High risk HPV: NEGATIVE
Neisseria Gonorrhea: NEGATIVE
Trichomonas: NEGATIVE

## 2022-06-06 MED ORDER — METRONIDAZOLE 500 MG PO TABS: 500.0000 mg | ORAL_TABLET | Freq: Two times a day (BID) | ORAL | 0 refills | Status: AC

## 2022-09-25 ENCOUNTER — Encounter: Payer: Self-pay | Admitting: Family Medicine

## 2022-09-25 ENCOUNTER — Ambulatory Visit: Payer: Medicaid Other | Admitting: Family Medicine

## 2022-09-25 ENCOUNTER — Other Ambulatory Visit (HOSPITAL_COMMUNITY)
Admission: RE | Admit: 2022-09-25 | Discharge: 2022-09-25 | Disposition: A | Discharge status: Home / Self Care | Payer: Medicaid Other | Source: Ambulatory Visit | Attending: Family Medicine | Admitting: Family Medicine

## 2022-09-25 VITALS — BP 130/107 | HR 94 | Temp 98.1°F | Ht 63.0 in | Wt 235.8 lb

## 2022-09-25 DIAGNOSIS — Z113 Encounter for screening for infections with a predominantly sexual mode of transmission: Secondary | ICD-10-CM

## 2022-09-25 DIAGNOSIS — N898 Other specified noninflammatory disorders of vagina: Secondary | ICD-10-CM | POA: Diagnosis present

## 2022-09-25 DIAGNOSIS — A599 Trichomoniasis, unspecified: Secondary | ICD-10-CM

## 2022-09-25 DIAGNOSIS — R030 Elevated blood-pressure reading, without diagnosis of hypertension: Secondary | ICD-10-CM | POA: Diagnosis not present

## 2022-09-25 HISTORY — DX: Trichomoniasis, unspecified: A59.9

## 2022-09-25 LAB — POCT URINE PREGNANCY: Preg Test, Ur: NEGATIVE

## 2022-09-25 LAB — POCT WET PREP (WET MOUNT): Clue Cells Wet Prep Whiff POC: NEGATIVE

## 2022-09-25 MED ORDER — METRONIDAZOLE 500 MG PO TABS: 500.0000 mg | ORAL_TABLET | Freq: Two times a day (BID) | ORAL | 0 refills | Status: DC

## 2022-09-25 NOTE — Patient Instructions (Addendum)
It was great to meet you!  Your testing was positive for trichomonas today, which is a sexually transmitted infection. I sent a medication to your pharmacy to treat this. Please have all sexual partners tested/treated as well. Abstain from sexual intercourse until you and your partners have completed treatment.  In general, use barrier protection (condoms) to prevent sexually transmitted infections.  I will send you a MyChart message in approximately 2 days with the results of your other testing.  If you change your mind and want to discuss other birth control options (aside from depo that do not cause weight gain), please schedule an appointment. Bedsider.org is an excellent resource for you to do some research on your own as well.  Your blood pressure was elevated today.  Please schedule follow-up visit with Dr. Jena Gauss in 2 weeks. Check your blood pressure at home once daily and bring a written log of the numbers to your next visit.  Also schedule a follow-up visit in 4-6 weeks for repeat trich testing.  Take care, Dr Anner Crete

## 2022-09-25 NOTE — Assessment & Plan Note (Addendum)
GC/chlamydia, RPR, HIV obtained today.  Will follow-up results via MyChart.  Offered to discuss contraception options but patient not interested at this time.  Advised barrier protection.

## 2022-09-25 NOTE — Progress Notes (Signed)
    SUBJECTIVE:   CHIEF COMPLAINT / HPI:   Vaginal discharge Patient here for evaluation of vaginal discharge x1 week.  Associated odor and slight itching.  Also reports her urine has been cloudy.  No dysuria, frequency, or urgency.  Sexually active with new partner.  No known STI exposures. Not using anything for contraception.  Does not desire pregnancy but is not interested in any form of birth control at this time.  LMP 09/01/2022.  PERTINENT  PMH / PSH: None pertinent  OBJECTIVE:   BP (!) 130/107   Pulse 94   Temp 98.1 F (36.7 C)   Ht 5\' 3"  (1.6 m)   Wt 235 lb 12.8 oz (107 kg)   LMP 09/01/2022   SpO2 98%   BMI 41.77 kg/m   General: NAD, pleasant, able to participate in exam Respiratory: No respiratory distress Skin: warm and dry, no rashes noted Psych: Normal affect and mood Neuro: grossly intact GU/GYN: Exam performed in the presence of a chaperone. External genitalia within normal limits.  Vaginal mucosa pink, moist, normal rugae.  Nonfriable cervix without lesions, no bleeding noted on speculum exam. Small amount of green discharge present.   ASSESSMENT/PLAN:   Routine screening for STI (sexually transmitted infection) GC/chlamydia, RPR, HIV obtained today.  Will follow-up results via MyChart.  Offered to discuss contraception options but patient not interested at this time.  Advised barrier protection.  Trichomonas infection Wet prep positive for trichomonas today.  Rx sent for metronidazole 500 mg BID x 7 days.  Recommended all sexual partners be tested/treated as well.  Discussed barrier protection and safe sex practices.  Recommend follow-up in 4-6 weeks for test of cure.   Elevated blood pressure reading BP elevated today x 2.  History of gestational HTN and elevated blood pressure at multiple prior visits.  Recommended home BP monitoring and follow-up in 2 weeks with PCP.  Will likely need antihypertensive medication at that time.     Maury Dus, MD [facility]  [facility]

## 2022-09-25 NOTE — Assessment & Plan Note (Signed)
BP elevated today x 2.  History of gestational HTN and elevated blood pressure at multiple prior visits.  Recommended home BP monitoring and follow-up in 2 weeks with PCP.  Will likely need antihypertensive medication at that time.

## 2022-09-25 NOTE — Assessment & Plan Note (Signed)
Wet prep positive for trichomonas today.  Rx sent for metronidazole 500 mg BID x 7 days.  Recommended all sexual partners be tested/treated as well.  Discussed barrier protection and safe sex practices.  Recommend follow-up in 4-6 weeks for test of cure.

## 2022-09-26 LAB — CERVICOVAGINAL ANCILLARY ONLY
Chlamydia: NEGATIVE
Comment: NEGATIVE
Comment: NEGATIVE
Comment: NORMAL
Neisseria Gonorrhea: NEGATIVE
Trichomonas: POSITIVE — AB

## 2022-09-26 LAB — RPR: RPR Ser Ql: NONREACTIVE

## 2022-09-26 LAB — HIV ANTIBODY (ROUTINE TESTING W REFLEX): HIV Screen 4th Generation wRfx: NONREACTIVE

## 2022-10-20 ENCOUNTER — Ambulatory Visit: Payer: Medicaid Other | Admitting: Student

## 2022-10-20 ENCOUNTER — Encounter: Payer: Self-pay | Admitting: Student

## 2022-10-20 VITALS — BP 120/82 | HR 97 | Ht 63.0 in | Wt 238.0 lb

## 2022-10-20 DIAGNOSIS — R030 Elevated blood-pressure reading, without diagnosis of hypertension: Secondary | ICD-10-CM

## 2022-10-20 NOTE — Progress Notes (Signed)
  SUBJECTIVE:   CHIEF COMPLAINT / HPI:   Elevated BP: -x2 checks on last visit  -h/o gHTN -no antihypertensives  -never needed them in pregnancy either -unable to check at home   PERTINENT  PMH / PSH:  H/o ghtn   Patient Care Team: Katherine Martinez, MD as PCP - General (Family Medicine) OBJECTIVE:  BP 120/82   Pulse 97   Ht 5\' 3"  (1.6 m)   Wt 238 lb (108 kg)   LMP 10/04/2022   SpO2 99%   BMI 42.16 kg/m  Physical Exam  General: Alert and oriented in no apparent distress Heart: Regular rate and rhythm with no murmurs appreciated Lungs: CTA bilaterally, no wheezing Abdomen: Bowel sounds present, no abdominal pain Skin: Warm and dry   ASSESSMENT/PLAN:  Elevated blood pressure reading Assessment & Plan: Normal BP today, continue monitoring. No med needed currently.     Return in about 1 week (around 10/27/2022). Katherine Martinez, MD 10/20/2022, 4:47 PM PGY-2, [facility] Family Medicine

## 2022-10-20 NOTE — Assessment & Plan Note (Signed)
Normal BP today, continue monitoring. No med needed currently.

## 2022-10-20 NOTE — Patient Instructions (Addendum)
It was great to see you today! Thank you for choosing [facility] for your primary care. Katherine Ayers was seen for follow up.  Today we addressed: Your blood pressure looks good!  If you haven't already, sign up for My Chart to have easy access to your labs results, and communication with your primary care physician.  I recommend that you always bring your medications to each appointment as this makes it easy to ensure you are on the correct medications and helps [place] not miss refills when you need them. Call the clinic at [phone] if your symptoms worsen or you have any concerns.  You should return to our clinic Return in about 1 week (around 10/27/2022). Please arrive 15 minutes before your appointment to ensure smooth check in process.  We appreciate your efforts in making this happen.  Thank you for allowing me to participate in your care, Alfredo Martinez, MD 10/20/2022, 3:36 PM PGY-2, [facility] Family Medicine

## 2022-10-31 ENCOUNTER — Encounter: Payer: Self-pay | Admitting: Student

## 2022-10-31 ENCOUNTER — Ambulatory Visit: Payer: Medicaid Other | Admitting: Student

## 2022-10-31 VITALS — BP 112/78 | HR 68 | Ht 63.0 in | Wt 235.0 lb

## 2022-10-31 DIAGNOSIS — A599 Trichomoniasis, unspecified: Secondary | ICD-10-CM | POA: Diagnosis not present

## 2022-10-31 LAB — POCT WET PREP (WET MOUNT)
Clue Cells Wet Prep Whiff POC: NEGATIVE
WBC, Wet Prep HPF POC: >20

## 2022-10-31 MED ORDER — TINIDAZOLE 500 MG PO TABS: 2.0000 g | ORAL_TABLET | Freq: Once | ORAL | 0 refills | Status: AC

## 2022-10-31 NOTE — Patient Instructions (Addendum)
It was great to see you today! Thank you for choosing [facility] for your primary care. Katherine Ayers was seen for follow up.  Today we addressed: We will treat as we need  Please make sure you use protection with intercourse   If you haven't already, sign up for My Chart to have easy access to your labs results, and communication with your primary care physician.  I recommend that you always bring your medications to each appointment as this makes it easy to ensure you are on the correct medications and helps [place] not miss refills when you need them. Call the clinic at [phone] if your symptoms worsen or you have any concerns.  You should return to our clinic Return if symptoms worsen or fail to improve. Please arrive 15 minutes before your appointment to ensure smooth check in process.  We appreciate your efforts in making this happen.  Thank you for allowing me to participate in your care, Alfredo Martinez, MD 10/31/2022, 3:23 PM PGY-2, [facility] Family [facility]

## 2022-10-31 NOTE — Progress Notes (Signed)
  SUBJECTIVE:   CHIEF COMPLAINT / HPI:   STI check - recently became sexually active with a new partner, wants to be checked for STIs. Previously tested for trich and was positive.  - preferred gender of partner: male - Medications tried: Finished all metronidazole  - Not sexually active with her last partner since trich diagnosis  - Contraception: Has declined, currently on menstrual  Symptoms include: Abnormal vaginal discharge and itching/irritation No fever or chills, no abdominal pain (normal menstrual cramps) Previously with trichomonas, treated for this but continuing to have discharge.  PERTINENT  PMH / PSH:   Past Medical History:  Diagnosis Date   No pertinent past medical history     Patient Care Team: Alfredo Martinez, MD as PCP - General (Family Medicine) OBJECTIVE:  BP 112/78   Pulse 68   Ht 5\' 3"  (1.6 m)   Wt 235 lb (106.6 kg)   LMP 10/31/2022   SpO2 98%   BMI 41.63 kg/m  Physical Exam  General: Alert and oriented in no apparent distress Heart: Regular rate and rhythm with no murmurs appreciated Lungs: Normal WOB Abdomen: no abdominal pain Skin: Warm and dry Extremities: No lower extremity edema GU: Chaperoned by Sharlynn Oliphant Fleeger  Blood in vaginal vault  No rash or ulcerations Otherwise normal    ASSESSMENT/PLAN:  Trichomonas infection Assessment & Plan: Will retreat for trichomonas as she was positive once again for trich regardless of previous treatment.  Treat with tinidazole x1 dose.  Recheck in 4 weeks.   Orders: -     POCT Wet Prep [facility]) -     Tinidazole; Take 4 tablets (2,000 mg total) by mouth once for 1 dose.  Dispense: 4 tablet; Refill: 0   Return if symptoms worsen or fail to improve. Alfredo Martinez, MD 10/31/2022, 4:31 PM PGY-2, [facility] Family [facility]

## 2022-10-31 NOTE — Assessment & Plan Note (Signed)
Will retreat for trichomonas as she was positive once again for trich regardless of previous treatment.  Treat with tinidazole x1 dose.  Recheck in 4 weeks.

## 2023-01-01 ENCOUNTER — Ambulatory Visit: Payer: Medicaid Other | Admitting: Student

## 2023-01-01 ENCOUNTER — Encounter: Payer: Self-pay | Admitting: Student

## 2023-01-01 ENCOUNTER — Other Ambulatory Visit (HOSPITAL_COMMUNITY)
Admission: RE | Admit: 2023-01-01 | Discharge: 2023-01-01 | Disposition: A | Discharge status: Home / Self Care | Payer: Medicaid Other | Source: Ambulatory Visit | Attending: Family Medicine | Admitting: Family Medicine

## 2023-01-01 VITALS — BP 136/85 | HR 96 | Ht 63.0 in | Wt 236.2 lb

## 2023-01-01 DIAGNOSIS — Z113 Encounter for screening for infections with a predominantly sexual mode of transmission: Secondary | ICD-10-CM | POA: Diagnosis not present

## 2023-01-01 DIAGNOSIS — Z0000 Encounter for general adult medical examination without abnormal findings: Secondary | ICD-10-CM | POA: Diagnosis not present

## 2023-01-01 DIAGNOSIS — A599 Trichomoniasis, unspecified: Secondary | ICD-10-CM

## 2023-01-01 LAB — POCT URINALYSIS DIP (MANUAL ENTRY)
Blood, UA: NEGATIVE
Glucose, UA: NEGATIVE mg/dL
Ketones, POC UA: NEGATIVE mg/dL
Nitrite, UA: NEGATIVE
Protein Ur, POC: NEGATIVE mg/dL
Spec Grav, UA: >=1.03 — AB (ref 1.010–1.025)
Urobilinogen, UA: 0.2 E.U./dL
pH, UA: 5.5 (ref 5.0–8.0)

## 2023-01-01 NOTE — Patient Instructions (Addendum)
It was great to see you today! Thank you for choosing [facility] for your primary care.  Today we addressed: We will get some labs and I will call with results   If you haven't already, sign up for My Chart to have easy access to your labs results, and communication with your primary care physician. We are checking some labs today. If they are abnormal, I will call you. If they are normal, I will send you a MyChart message (if it is active) or a letter in the mail. If you do not hear about your labs in the next 2 weeks, please call the office. I recommend that you always bring your medications to each appointment as this makes it easy to ensure you are on the correct medications and helps [place] not miss refills when you need them. Call the clinic at [phone] if your symptoms worsen or you have any concerns. Return for Vaginal Discharge. Please arrive 15 minutes before your appointment to ensure smooth check in process.  We appreciate your efforts in making this happen.  Thank you for allowing me to participate in your care, Alfredo Martinez, MD 01/01/2023, 3:38 PM PGY-3, [facility]

## 2023-01-01 NOTE — Progress Notes (Signed)
    SUBJECTIVE:   Chief compliant/HPI: annual examination  MASEL HANING is a 32 y.o. who presents today for an annual exam.   Review of systems form notable for continued vaginal discharge. No abnormal bleeding, no abdominal pain. White, liquid discharge, trichomonas x2 treated with last treatment 2 months ago. No fever or chills. Not sexually active since last visit.   Updated history tabs and problem list.   OBJECTIVE:   BP 136/85   Pulse 96   Ht 5\' 3"  (1.6 m)   Wt 236 lb 4 oz (107.2 kg)   LMP 12/02/2022   SpO2 100%   BMI 41.85 kg/m   General: Alert and oriented in no apparent distress Heart: Regular rate and rhythm with no murmurs appreciated Lungs: Normal WOB Abdomen: no abdominal pain Skin: Warm and dry Extremities: No lower extremity edema GU: Normal vulva, vagina, white, scant discharge, no odor  Normal cervix Chaperone present   ASSESSMENT/PLAN:   Trichomonas infection Recheck for TOC today   Annual physical exam HIV, RPR ordered, candida series 6, ureaplasma/mycoplasma, wet prep, trichomonas, GC/Chlamydia.     Annual Examination  See AVS for age appropriate recommendations.   PHQ score     01/01/2023    3:12 PM 10/31/2022    2:58 PM 10/20/2022    3:00 PM  PHQ9 SCORE ONLY  PHQ-9 Total Score 0 0 0  reviewed and discussed. Blood pressure reviewed and at goal.  Asked about intimate partner violence and patient reports no concerns.  The patient currently uses nothing for contraception. Folate recommended as appropriate, minimum of 400 mcg per day.   Considered the following items based upon USPSTF recommendations: HIV testing: NR 3 months ago  Hepatitis C: 7 mo ago NR Hepatitis B: N/A Syphilis if at high risk: 3 mo ago negative  GC/CT negative 3 mo ago  Lipid panel (nonfasting or fasting) discussed based upon AHA recommendations and ordered.  Consider repeat every 4-6 years. A1C and TSH ordered for completion sake.  Anemia previously, checking CBC  w/diff and CMP.  Reviewed risk factors for latent tuberculosis and not indicated Discussed family history, BRCA testing not indicated.  Cervical cancer screening: prior Pap reviewed, repeat due in 5 years --HR HPV negative, NILM  Will call/message with results   Alfredo Martinez, MD [facility]

## 2023-01-01 NOTE — Assessment & Plan Note (Signed)
HIV, RPR ordered, candida series 6, ureaplasma/mycoplasma, wet prep, trichomonas, GC/Chlamydia.

## 2023-01-01 NOTE — Assessment & Plan Note (Signed)
Recheck for TOC today

## 2023-01-05 LAB — CERVICOVAGINAL ANCILLARY ONLY
Chlamydia: NEGATIVE
Comment: NEGATIVE
Comment: NEGATIVE
Comment: NORMAL
Neisseria Gonorrhea: NEGATIVE
Trichomonas: NEGATIVE

## 2023-01-07 LAB — CBC WITH DIFFERENTIAL/PLATELET
Basophils Absolute: 0 10*3/uL (ref 0.0–0.2)
Basos: 1 %
EOS (ABSOLUTE): 0.1 10*3/uL (ref 0.0–0.4)
Eos: 1 %
Hematocrit: 34.8 % (ref 34.0–46.6)
Hemoglobin: 10.7 g/dL — ABNORMAL LOW (ref 11.1–15.9)
Immature Grans (Abs): 0 10*3/uL (ref 0.0–0.1)
Immature Granulocytes: 0 %
Lymphocytes Absolute: 2.4 10*3/uL (ref 0.7–3.1)
Lymphs: 38 %
MCH: 25.1 pg — ABNORMAL LOW (ref 26.6–33.0)
MCHC: 30.7 g/dL — ABNORMAL LOW (ref 31.5–35.7)
MCV: 82 fL (ref 79–97)
Monocytes Absolute: 0.5 10*3/uL (ref 0.1–0.9)
Monocytes: 8 %
Neutrophils Absolute: 3.3 10*3/uL (ref 1.4–7.0)
Neutrophils: 52 %
Platelets: 289 10*3/uL (ref 150–450)
RBC: 4.26 x10E6/uL (ref 3.77–5.28)
RDW: 15.1 % (ref 11.7–15.4)
WBC: 6.3 10*3/uL (ref 3.4–10.8)

## 2023-01-07 LAB — LIPID PANEL
Chol/HDL Ratio: 4.7 ratio — ABNORMAL HIGH (ref 0.0–4.4)
Cholesterol, Total: 194 mg/dL (ref 100–199)
HDL: 41 mg/dL (ref >39)
LDL Chol Calc (NIH): 134 mg/dL — ABNORMAL HIGH (ref 0–99)
Triglycerides: 107 mg/dL (ref 0–149)
VLDL Cholesterol Cal: 19 mg/dL (ref 5–40)

## 2023-01-07 LAB — COMPREHENSIVE METABOLIC PANEL
ALT: 17 IU/L (ref 0–32)
AST: 25 IU/L (ref 0–40)
Albumin: 3.8 g/dL — ABNORMAL LOW (ref 3.9–4.9)
Alkaline Phosphatase: 70 IU/L (ref 44–121)
BUN/Creatinine Ratio: 7 — ABNORMAL LOW (ref 9–23)
BUN: 5 mg/dL — ABNORMAL LOW (ref 6–20)
Bilirubin Total: 0.2 mg/dL (ref 0.0–1.2)
CO2: 21 mmol/L (ref 20–29)
Calcium: 8.9 mg/dL (ref 8.7–10.2)
Chloride: 103 mmol/L (ref 96–106)
Creatinine, Ser: 0.71 mg/dL (ref 0.57–1.00)
Globulin, Total: 2.9 g/dL (ref 1.5–4.5)
Glucose: 79 mg/dL (ref 70–99)
Potassium: 4 mmol/L (ref 3.5–5.2)
Sodium: 138 mmol/L (ref 134–144)
Total Protein: 6.7 g/dL (ref 6.0–8.5)
eGFR: 117 mL/min/{1.73_m2} (ref >59)

## 2023-01-07 LAB — HEMOGLOBIN A1C
Est. average glucose Bld gHb Est-mCnc: 123 mg/dL
Hgb A1c MFr Bld: 5.9 % — ABNORMAL HIGH (ref 4.8–5.6)

## 2023-01-07 LAB — TSH RFX ON ABNORMAL TO FREE T4: TSH: 0.937 u[IU]/mL (ref 0.450–4.500)

## 2023-01-07 LAB — MYCOPLASMA / UREAPLASMA CULTURE
Mycoplasma hominis Culture: NEGATIVE
Ureaplasma urealyticum: NEGATIVE

## 2023-01-07 LAB — RPR: RPR Ser Ql: NONREACTIVE

## 2023-01-07 LAB — HIV ANTIBODY (ROUTINE TESTING W REFLEX): HIV Screen 4th Generation wRfx: NONREACTIVE

## 2023-01-14 ENCOUNTER — Encounter: Payer: Self-pay | Admitting: Student

## 2023-02-02 ENCOUNTER — Ambulatory Visit: Payer: Medicaid Other | Admitting: Student

## 2023-02-17 ENCOUNTER — Ambulatory Visit: Payer: Medicaid Other | Admitting: Student

## 2023-02-17 ENCOUNTER — Encounter: Payer: Self-pay | Admitting: Student

## 2023-02-17 VITALS — BP 145/98 | HR 93 | Ht 63.0 in | Wt 237.4 lb

## 2023-02-17 DIAGNOSIS — N898 Other specified noninflammatory disorders of vagina: Secondary | ICD-10-CM | POA: Diagnosis present

## 2023-02-17 DIAGNOSIS — D649 Anemia, unspecified: Secondary | ICD-10-CM | POA: Diagnosis not present

## 2023-02-17 DIAGNOSIS — R030 Elevated blood-pressure reading, without diagnosis of hypertension: Secondary | ICD-10-CM | POA: Diagnosis not present

## 2023-02-17 NOTE — Patient Instructions (Addendum)
It was great to see you today! Thank you for choosing [facility] for your primary care.  Today we addressed: We will send you to the [facility] doctors  I will check iron studies to see if you need iron  Return in 3 months   We will have you come in for a nursing visit in one week to have blood pressure rechecked   If you haven't already, sign up for My Chart to have easy access to your labs results, and communication with your primary care physician. We are checking some labs today. If they are abnormal, I will call you. If they are normal, I will send you a MyChart message (if it is active) or a letter in the mail. If you do not hear about your labs in the next 2 weeks, please call the office. I recommend that you always bring your medications to each appointment as this makes it easy to ensure you are on the correct medications and helps [place] not miss refills when you need them. Call the clinic at [phone] if your symptoms worsen or you have any concerns. Return in about 3 months (around 05/20/2023) for Discharge and Iron follow up . Please arrive 15 minutes before your appointment to ensure smooth check in process.  We appreciate your efforts in making this happen.  Thank you for allowing me to participate in your care, Alfredo Martinez, MD 02/17/2023, 8:49 AM PGY-3, [facility]

## 2023-02-17 NOTE — Progress Notes (Signed)
    SUBJECTIVE:   CHIEF COMPLAINT / HPI:   Vaginal Discharge -Has had vaginal discharge since May 2024, was treated for trichomonas in June 2024. Negative for STIs on 01/01/23.  -Continue to have off-white vaginal discharge throughout the day -No sexual activity since last visit  -Does not wants BC at this time  -No abdominal pain or abnormal cramping   Anemia  -H/o Anemia on CBC's in past -No previous ferritin of note  -No dizziness or fatigue  -No active bleeding  -No fever or chills   -Declined flu   PERTINENT  PMH / PSH:  None   OBJECTIVE:   BP (!) 145/98   Pulse 93   Ht 5\' 3"  (1.6 m)   Wt 237 lb 6.4 oz (107.7 kg)   SpO2 100%   BMI 42.05 kg/m   General: Alert and oriented in no apparent distress Heart: Regular rate and rhythm with no murmurs appreciated Lungs: CTA bilaterally, no wheezing Abdomen: no abdominal pain Skin: Warm and dry   ASSESSMENT/PLAN:   Assessment & Plan Vaginal discharge Previously checked for mycoplasma/ureaplasma as well as STIs and wet prep--all negative. Will send to OB to assess for source of discharge although I am unsure if there is another possible cause to be tested for. Follow up if birth control is wanted in the future.  Anemia, unspecified type No previous ferritin, check iron studies. May need iron in the future. Will update with results. Also, checking hemoglobin electrophoresis.  Elevated blood pressure reading x2 in office, come in for nursing visit in 1 week for BP recheck. If still elevated, will discuss medications.      Alfredo Martinez, MD [facility]

## 2023-02-17 NOTE — Progress Notes (Deleted)
    SUBJECTIVE:   CHIEF COMPLAINT / HPI:   Vaginal Discharge -Has had vaginal discharge since May 2024, was treated for trichomonas in June 2024. Negative for STIs on 01/01/23.  -No new partners since last visit  -No abdominal pain -No itching    Anemia  H/o Anemia on CBC's in past No previous ferritin of note  No dizziness or fatigue  No active bleeding    Declined flu   PERTINENT  PMH / PSH:  None   OBJECTIVE:   BP (!) 139/94   Pulse 93   Ht 5\' 3"  (1.6 m)   Wt 237 lb 6.4 oz (107.7 kg)   SpO2 100%   BMI 42.05 kg/m   General: Alert and oriented in no apparent distress Heart: Regular rate and rhythm with no murmurs appreciated Lungs: CTA bilaterally, no wheezing Abdomen: Bowel sounds present, no abdominal pain Skin: Warm and dry Extremities: No lower extremity edema   ASSESSMENT/PLAN:   No problem-specific Assessment & Plan notes found for this encounter.     Alfredo Martinez, MD [facility]

## 2023-02-17 NOTE — Assessment & Plan Note (Signed)
x2 in office, come in for nursing visit in 1 week for BP recheck. If still elevated, will discuss medications.

## 2023-02-18 ENCOUNTER — Other Ambulatory Visit: Payer: Self-pay | Admitting: Student

## 2023-02-18 MED ORDER — FERROUS SULFATE 325 (65 FE) MG PO TABS: 325.0000 mg | ORAL_TABLET | ORAL | 0 refills | Status: DC

## 2023-02-23 ENCOUNTER — Ambulatory Visit: Payer: Medicaid Other

## 2023-02-23 VITALS — BP 124/82 | HR 78

## 2023-02-23 DIAGNOSIS — Z0130 Encounter for examination of blood pressure without abnormal findings: Secondary | ICD-10-CM

## 2023-02-23 LAB — IRON,TIBC AND FERRITIN PANEL
Ferritin: 13 ng/mL — ABNORMAL LOW (ref 15–150)
Iron Saturation: 11 % — ABNORMAL LOW (ref 15–55)
Iron: 39 ug/dL (ref 27–159)
Total Iron Binding Capacity: 352 ug/dL (ref 250–450)
UIBC: 313 ug/dL (ref 131–425)

## 2023-02-23 LAB — HGB FRACTIONATION CASCADE
Hgb A2: 2.6 % (ref 1.8–3.2)
Hgb A: 97.4 % (ref 96.4–98.8)
Hgb F: 0 % (ref 0.0–2.0)
Hgb S: 0 %

## 2023-02-23 NOTE — Progress Notes (Signed)
Patient presents to nurse clinic for BP check.   Last BP was on 02/17/23 and was 145/98.   Measured BP in right arm with manual large adult cuff. Patient was resting 10 minutes prior to BP check.   Today's Vitals   02/23/23 0948  BP: 124/82  Pulse: 78  SpO2: 99%   Advised patient to follow up with PCP in 3 months per note from Dr. Jena Gauss.   Patient will call back to schedule as we are unable to schedule for January at this time.

## 2023-03-23 ENCOUNTER — Ambulatory Visit: Payer: Medicaid Other | Admitting: Student

## 2023-03-23 ENCOUNTER — Encounter: Payer: Self-pay | Admitting: Student

## 2023-03-23 ENCOUNTER — Other Ambulatory Visit (HOSPITAL_COMMUNITY)
Admission: RE | Admit: 2023-03-23 | Discharge: 2023-03-23 | Disposition: A | Discharge status: Home / Self Care | Payer: Medicaid Other | Source: Ambulatory Visit | Attending: Family Medicine | Admitting: Family Medicine

## 2023-03-23 VITALS — BP 126/80 | HR 97 | Wt 238.0 lb

## 2023-03-23 DIAGNOSIS — N898 Other specified noninflammatory disorders of vagina: Secondary | ICD-10-CM | POA: Diagnosis present

## 2023-03-23 LAB — POCT WET PREP (WET MOUNT)
Clue Cells Wet Prep Whiff POC: NEGATIVE
Trichomonas Wet Prep HPF POC: ABSENT

## 2023-03-23 NOTE — Patient Instructions (Signed)
It was great to see you today! Thank you for choosing [facility] for your primary care.  Today we addressed: I will call with lab results Please let me know if you want to discuss contraception   If you haven't already, sign up for My Chart to have easy access to your labs results, and communication with your primary care physician. We are checking some labs today. If they are abnormal, I will call you. If they are normal, I will send you a MyChart message (if it is active) or a letter in the mail. If you do not hear about your labs in the next 2 weeks, please call the office. I recommend that you always bring your medications to each appointment as this makes it easy to ensure you are on the correct medications and helps [place] not miss refills when you need them. Call the clinic at [phone] if your symptoms worsen or you have any concerns.  Please arrive 15 minutes before your appointment to ensure smooth check in process.  We appreciate your efforts in making this happen.  Thank you for allowing me to participate in your care, Alfredo Martinez, MD 03/23/2023, 2:43 PM PGY-3, [facility]

## 2023-03-23 NOTE — Progress Notes (Unsigned)
    SUBJECTIVE:   CHIEF COMPLAINT / HPI:   STI check - Not sexually active, no OCPs-declines BC - Medications tried: None  - Last sexual encounter: None recent  - Contraception: None  Symptoms include: Has had persistent vaginal discharge, despite multiple negative swabs. Has to wear a panty liner due to copious discharge. No other symptoms    PERTINENT  PMH / PSH: None pertinent   OBJECTIVE:   BP 126/80   Pulse 97   Wt 238 lb (108 kg)   SpO2 99%   BMI 42.16 kg/m   General: NAD, pleasant, able to participate in exam Respiratory: No respiratory distress Skin: warm and dry, no rashes noted Psych: Normal affect and mood GU: Chaperone present, thin discharge, clear, no other abnormalities on examination   ASSESSMENT/PLAN:   Assessment & Plan Vaginal discharge Reviewed labs and allergies, will check GC/Chlamydia, Trichomonas, RPR, and HIV wet prep and candida 6 series, and will call patient with results. Sent to OBGYN given inability to find cause of vaginal discharge although possibly normal for patient, hormonally caused? Gave her the [facility] clinic number from referral. Patient's questions answered to their satisfaction.      Alfredo Martinez, MD [facility]

## 2023-03-24 LAB — CERVICOVAGINAL ANCILLARY ONLY
Chlamydia: NEGATIVE
Comment: NEGATIVE
Comment: NEGATIVE
Comment: NORMAL
Neisseria Gonorrhea: NEGATIVE
Trichomonas: NEGATIVE

## 2023-03-24 LAB — RPR: RPR Ser Ql: NONREACTIVE

## 2023-03-24 LAB — HIV ANTIBODY (ROUTINE TESTING W REFLEX): HIV Screen 4th Generation wRfx: NONREACTIVE

## 2023-03-26 LAB — CANDIDA 6 SPECIES PROFILE, NAA
C PARAPSILOSIS/TROPICALIS: NEGATIVE
Candida albicans, NAA: NEGATIVE
Candida glabrata, NAA: NEGATIVE
Candida krusei, NAA: NEGATIVE
Candida lusitaniae, NAA: NEGATIVE

## 2023-04-01 ENCOUNTER — Encounter: Payer: Self-pay | Admitting: Student

## 2023-05-21 ENCOUNTER — Other Ambulatory Visit: Payer: Self-pay | Admitting: Student

## 2023-07-13 ENCOUNTER — Ambulatory Visit: Payer: Medicaid Other | Admitting: Student

## 2023-07-14 ENCOUNTER — Ambulatory Visit: Payer: Medicaid Other | Admitting: Student

## 2023-07-17 ENCOUNTER — Ambulatory Visit: Payer: Medicaid Other | Admitting: Student

## 2023-07-31 ENCOUNTER — Other Ambulatory Visit (HOSPITAL_COMMUNITY)
Admission: RE | Admit: 2023-07-31 | Discharge: 2023-07-31 | Disposition: A | Discharge status: Home / Self Care | Source: Ambulatory Visit

## 2023-07-31 ENCOUNTER — Ambulatory Visit: Payer: Medicaid Other

## 2023-07-31 VITALS — BP 141/94 | HR 79 | Ht 63.0 in | Wt 236.4 lb

## 2023-07-31 DIAGNOSIS — Z113 Encounter for screening for infections with a predominantly sexual mode of transmission: Secondary | ICD-10-CM | POA: Diagnosis not present

## 2023-07-31 DIAGNOSIS — Z1239 Encounter for other screening for malignant neoplasm of breast: Secondary | ICD-10-CM | POA: Diagnosis not present

## 2023-07-31 DIAGNOSIS — N898 Other specified noninflammatory disorders of vagina: Secondary | ICD-10-CM

## 2023-07-31 DIAGNOSIS — Z01419 Encounter for gynecological examination (general) (routine) without abnormal findings: Secondary | ICD-10-CM

## 2023-07-31 MED ORDER — FLUCONAZOLE 150 MG PO TABS: 150.0000 mg | ORAL_TABLET | ORAL | 1 refills | Status: DC

## 2023-07-31 NOTE — Progress Notes (Signed)
 Pt c/o excessive vagina discharge for years. Requesting STD testing.

## 2023-07-31 NOTE — Progress Notes (Signed)
 GYNECOLOGY OFFICE VISIT NOTE-WELL WOMAN EXAM  History:   Katherine Ayers is a 33 year old here today for annual exam.  Patient reports LMP was Feb 20 and was normal 7 days with "pretty heavy" flow and occasional cramping.  She reports taking tylenol with relief.    Birth Control:  None; Does not desire pregnancy.   Reproductive Concerns Sexually Active: Yes; No pain or discomfort.  Partners Type: Female Number of partners in last year: One STD Testing: Full  Obstetrical History: [id number]  Gynecological History: No history of abnormal paps.  No history of gyn surgeries.  Vaginal/GU Concerns: Patient reports she has ongoing vaginal discharge for "a couple years."  She states the discharge is daily and is white and without odor, itching or irritation. She states she has not tried anything for the discharge. She does report wearing a panty liner daily.  No issues with urination, constipation, or diarrhea.   Breast Concerns/Exams: No concerns.  Does not check breast. She denies family history of breast, uterine, cervical, or ovarian cancer  Medical and Nutrition PCP: [facility] on [address]: Last seen last year Significant PMx: Reports some issues with Elevated BPs and Iron levels. Exercise: No Tobacco/Drugs/Alcohol/Vaping: Daily Vaping Nutrition: Denies balanced intake. Reports "I am trying to do better." Reports she is cooking more at home.   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 Lives with mother and two kids. Social Support: Endorses Employment: None Currently  Past Medical History:  Diagnosis Date   No pertinent past medical history     Past Surgical History:  Procedure Laterality Date   DILATION AND CURETTAGE OF UTERUS      The following portions of the patient's history were reviewed and updated as appropriate: allergies, current medications, past family history, past medical history, past social history, past surgical history and problem list.   Health  Maintenance: Pap: Jan 2024, Negative Results.  Mammogram: N/A.  Colonoscopy: N/A Review of Systems:  Pertinent items noted in HPI and remainder of comprehensive ROS otherwise negative.    Objective:    Physical Exam BP (!) 141/94   Pulse 79   Ht 5\' 3"  (1.6 m)   Wt 236 lb 6.4 oz (107.2 kg)   BMI 41.88 kg/m  Physical Exam Vitals and nursing note reviewed. Exam conducted with a chaperone present Morrie Sheldon, RN).  Constitutional:      Appearance: Normal appearance.  HENT:     Head: Normocephalic and atraumatic.  Eyes:     Conjunctiva/sclera: Conjunctivae normal.  Cardiovascular:     Rate and Rhythm: Normal rate and regular rhythm.     Heart sounds: Normal heart sounds.  Pulmonary:     Effort: Pulmonary effort is normal. No respiratory distress.     Breath sounds: Normal breath sounds.  Chest:  Breasts:    Right: No mass, nipple discharge, skin change or tenderness.     Left: No mass, nipple discharge, skin change or tenderness.     Comments: CBE completed and normal.  Abdominal:     General: Bowel sounds are normal.     Palpations: Abdomen is soft.     Tenderness: There is no abdominal tenderness.  Genitourinary:    Comments: NEFG. Small amt white discharge noted at introitus.  Vaginal [place] pink and with good rugae.  Small amt white curdy discharge noted. CV collected. Cervix not fully visualized d/t position.  Appears pink. BME completed and uterine size normal and w/o apparent tenderness.  Musculoskeletal:  General: Normal range of motion.     Cervical back: Normal range of motion.  Skin:    General: Skin is warm and dry.     Comments: Multiple tattoos noted  Neurological:     Mental Status: She is alert and oriented to person, place, and time.  Psychiatric:        Mood and Affect: Mood normal.        Behavior: Behavior normal.      Labs and Imaging No results found for this or any previous visit (from the past week). No results found.   Assessment & Plan:   33 year old female Well Woman Exam Breast Exam STD Testing  1. Well woman exam with routine gynecological exam (Primary) -Exam performed and findings discussed. -Educated on ASCCP guidelines regarding pap smear evaluation and frequency. -Previous pap Jan 2024 and normal with negative HPV.  -Encouraged to activate and utilize Mychart for reviewing of results, communication with office, and scheduling of appts. -Educated on AHA exercise recommendations of 30 minutes of moderate to vigorous activity at least 5x/week.   2. Screening breast examination -CBE completed and normal -Educated and encouraged to incorporate\ increase breast awareness including examination of breast for skin changes, moles, tenderness, etc.    3. Encounter for screening examination for sexually transmitted disease -H/O Trichomoniasis  -Cultures collected. -Labs ordered -Informed of turnover time and provider/clinic policy on releasing results. -Plan to treat accordingly.   4. Vaginal discharge -Reviewed exams findings suggestive of yeast. -Discussed treatment with Diflucan now and repeat in 72 hours if needed.  -Will await remainder of results and treat if needed.   Labs  - Cervicovaginal ancillary only( [place]) - HIV antibody (with reflex) - RPR - Hepatitis C Antibody - Hepatitis B Surface AntiGEN Routine preventative health maintenance measures emphasized. Please refer to After Visit Summary for other counseling recommendations.   No follow-ups on file.      Cherre Robins, CNM 07/31/2023

## 2023-08-01 LAB — RPR: RPR Ser Ql: NONREACTIVE

## 2023-08-01 LAB — HIV ANTIBODY (ROUTINE TESTING W REFLEX): HIV Screen 4th Generation wRfx: NONREACTIVE

## 2023-08-01 LAB — HEPATITIS C ANTIBODY: Hep C Virus Ab: NONREACTIVE

## 2023-08-01 LAB — HEPATITIS B SURFACE ANTIGEN: Hepatitis B Surface Ag: NEGATIVE

## 2023-08-03 LAB — CERVICOVAGINAL ANCILLARY ONLY
Bacterial Vaginitis (gardnerella): POSITIVE — AB
Candida Glabrata: NEGATIVE
Candida Vaginitis: NEGATIVE
Chlamydia: NEGATIVE
Comment: NEGATIVE
Comment: NEGATIVE
Comment: NEGATIVE
Comment: NEGATIVE
Comment: NEGATIVE
Comment: NORMAL
Neisseria Gonorrhea: NEGATIVE
Trichomonas: NEGATIVE

## 2023-08-07 MED ORDER — METRONIDAZOLE 500 MG PO TABS: 500.0000 mg | ORAL_TABLET | Freq: Two times a day (BID) | ORAL | 0 refills | Status: DC

## 2023-08-07 NOTE — Addendum Note (Signed)
 Addended by: Gerrit Heck L on: 08/07/2023 03:58 AM   Modules accepted: Orders

## 2023-08-21 ENCOUNTER — Other Ambulatory Visit: Payer: Self-pay | Admitting: Student

## 2023-08-24 ENCOUNTER — Other Ambulatory Visit (HOSPITAL_COMMUNITY)
Admission: RE | Admit: 2023-08-24 | Discharge: 2023-08-24 | Disposition: A | Discharge status: Home / Self Care | Source: Ambulatory Visit | Attending: Family Medicine | Admitting: Family Medicine

## 2023-08-24 ENCOUNTER — Encounter: Payer: Self-pay | Admitting: Family Medicine

## 2023-08-24 ENCOUNTER — Ambulatory Visit: Admitting: Family Medicine

## 2023-08-24 VITALS — BP 124/82 | HR 88 | Ht 63.0 in | Wt 238.0 lb

## 2023-08-24 DIAGNOSIS — N898 Other specified noninflammatory disorders of vagina: Secondary | ICD-10-CM

## 2023-08-24 DIAGNOSIS — Z113 Encounter for screening for infections with a predominantly sexual mode of transmission: Secondary | ICD-10-CM | POA: Diagnosis not present

## 2023-08-24 DIAGNOSIS — F17290 Nicotine dependence, other tobacco product, uncomplicated: Secondary | ICD-10-CM | POA: Diagnosis not present

## 2023-08-24 LAB — POCT URINE PREGNANCY: Preg Test, Ur: NEGATIVE

## 2023-08-24 NOTE — Progress Notes (Signed)
    SUBJECTIVE:   CHIEF COMPLAINT: STI check  HPI:   Katherine Ayers is a 33 y.o.  with prior normal Pap coming in for STI testing.   The patient reports a new partner.  She uses condoms  for contraception.  Last menstrual period Late February .  Current partners one.  Last Pap normal.  Prior infections include BV and trichomonas    PERTINENT  PMH / PSH/Family/Social History :  Obesity  Normal Pap   OBJECTIVE:   BP 124/82   Pulse 88   Ht 5\' 3"  (1.6 m)   Wt 238 lb (108 kg)   LMP 08/06/2023   SpO2 98%   BMI 42.16 kg/m   Today's weight:  Last Weight  Most recent update: 08/24/2023 10:54 AM    Weight  108 kg (238 lb)            Review of prior weights: Filed Weights   08/24/23 1054  Weight: 238 lb (108 kg)    GU Exam:    External exam: Normal-appearing female external genitalia.  Vaginal exam notable for modest discharge.  Cervix without discharge or obvious lesion.  Chaperoned examine, CMA [place].    ASSESSMENT/PLAN:   Assessment & Plan Vaginal discharge Routine testing today  Will message with results  UPT negative, discussed prenatal  Routine screening for STI (sexually transmitted infection) New partner, complete testing done  Vaping nicotine dependence, tobacco product QUIT resources given and discussed    Terisa Starr, MD  Family Medicine Teaching Service  [facility]

## 2023-08-24 NOTE — Patient Instructions (Signed)
 It was wonderful to see you today.  Please bring ALL of your medications with you to every visit.   Today we talked about:  I will message you with results  Take a a prenatal vitamin each day     Vaping use is damaging to your body. It increases your risk of stroke, heart attack, lung cancer, and serious lung disease in the future. It also reduces your fertility.   Quitting tobacco is the best thing for your health but is a challenge---nicotine, a chemical in cigarettes, is highly addictive.   You can call 1 800 QUIT NOW (312-557-9548)---you will be connected with a Careers information officer. They can also mail you nicotine gums, lozenges, and patches to quit.   Ask me about patches (which you wear all day) and gums (which you use when you have a craving) to help you quit.   There are safe, effective medications to help you quit--  Varencline---also called Chantix---- is the most common medication used to help people stop smoking. It starts a low dose and is increased. I recommended choosing a quit date then starting the medication 8 days before this. Side effects include mild headache, difficulty sleeping, and odd dreams. The medication is typically very well tolerated.     Bupropion---also called Zyban---- is started 1 week before your quit date. You take 1 pill for three days then increase to 1 pill twice per day. Side effects include a mild headache and anxiety---this usually goes away. Some patients experience weight loss.    Please follow up in 12 months   Thank you for choosing [facility].   Please call [phone] with any questions about today's appointment.  Please be sure to schedule follow up at the front  desk before you leave today.   Terisa Starr, MD  Family Medicine

## 2023-08-25 ENCOUNTER — Encounter: Payer: Self-pay | Admitting: Family Medicine

## 2023-08-25 LAB — HEPATITIS B SURFACE ANTIGEN: Hepatitis B Surface Ag: NEGATIVE

## 2023-08-25 LAB — CERVICOVAGINAL ANCILLARY ONLY
Bacterial Vaginitis (gardnerella): NEGATIVE
Candida Glabrata: NEGATIVE
Candida Vaginitis: NEGATIVE
Chlamydia: NEGATIVE
Comment: NEGATIVE
Comment: NEGATIVE
Comment: NEGATIVE
Comment: NEGATIVE
Comment: NEGATIVE
Comment: NORMAL
Neisseria Gonorrhea: NEGATIVE
Trichomonas: NEGATIVE

## 2023-08-25 LAB — HIV ANTIBODY (ROUTINE TESTING W REFLEX): HIV Screen 4th Generation wRfx: NONREACTIVE

## 2023-08-25 LAB — HCV INTERPRETATION

## 2023-08-25 LAB — RPR: RPR Ser Ql: NONREACTIVE

## 2023-08-25 LAB — HCV AB W REFLEX TO QUANT PCR: HCV Ab: NONREACTIVE

## 2023-08-25 LAB — HEPATITIS B CORE ANTIBODY, TOTAL: Hep B Core Total Ab: NEGATIVE

## 2023-10-27 ENCOUNTER — Encounter: Payer: Self-pay | Admitting: *Deleted

## 2023-11-06 ENCOUNTER — Ambulatory Visit: Payer: Self-pay | Admitting: Student

## 2023-11-06 ENCOUNTER — Ambulatory Visit: Admitting: Student

## 2023-11-06 ENCOUNTER — Other Ambulatory Visit (HOSPITAL_COMMUNITY)
Admission: RE | Admit: 2023-11-06 | Discharge: 2023-11-06 | Disposition: A | Discharge status: Home / Self Care | Source: Ambulatory Visit | Attending: Family Medicine | Admitting: Family Medicine

## 2023-11-06 ENCOUNTER — Encounter: Payer: Self-pay | Admitting: Student

## 2023-11-06 DIAGNOSIS — Z113 Encounter for screening for infections with a predominantly sexual mode of transmission: Secondary | ICD-10-CM | POA: Diagnosis present

## 2023-11-06 LAB — POCT WET PREP (WET MOUNT)
Clue Cells Wet Prep Whiff POC: NEGATIVE
Trichomonas Wet Prep HPF POC: ABSENT

## 2023-11-06 NOTE — Progress Notes (Signed)
    SUBJECTIVE:   CHIEF COMPLAINT / HPI:   STI check - recently became sexually active with a new partner, wants to be checked for STIs. Previously tested and grossly negative  - LMP May 23 - Contraception: Declines  Symptoms include: Chronic vaginal discharge but no knew    PERTINENT  PMH / PSH: Reviewed   OBJECTIVE:   BP (!) 132/97   Pulse 94   Ht 5' 3 (1.6 m)   Wt 236 lb 3.2 oz (107.1 kg)   LMP 10/11/2023   SpO2 98%   BMI 41.84 kg/m   General: Alert and oriented in no apparent distress Lung: Normal WOB GU: Normal exam with chaperone present, scant discharge  Skin: Warm and dry Extremities: No lower extremity edema   ASSESSMENT/PLAN:   Assessment & Plan Routine screening for STI (sexually transmitted infection) Reviewed labs and allergies, will check GC/Chlamydia, Trichomonas, RPR, and HIV as well as wet prep and will call patient with results. Discussed safe sex practices.  Patient's questions answered to their satisfaction.      Ernestina Headland, MD [facility]

## 2023-11-06 NOTE — Patient Instructions (Signed)
 It was great to see you today! Thank you for choosing [facility] for your primary care.  Today we addressed:  I will call you with the results of your labs. Please practice safe sex and let us  know if you need anything.   Local Resources:  [facility] [phone] [address], [place], [place] [address] ReportNation.uy  [facility] [phone] [address], [place], [place] [address]  [facility] [phone] [address] [place], [place] [address]  [place]: [address], [place], [place] [address] [phone]  [facility] [address] [place] [place], [place] [address]  [facility] [address] [place], [place] [address] [phone] Toll Free: [phone] [email or web address]   If you haven't already, sign up for My Chart to have easy access to your labs results, and communication with your primary care physician.   Please arrive 15 minutes before your appointment to ensure smooth check in process.  We appreciate your efforts in making this happen.  Thank you for allowing me to participate in your care, Ernestina Headland, MD 11/06/2023, 11:49 AM PGY-3, [facility]

## 2023-11-07 LAB — RPR: RPR Ser Ql: NONREACTIVE

## 2023-11-07 LAB — HIV ANTIBODY (ROUTINE TESTING W REFLEX): HIV Screen 4th Generation wRfx: NONREACTIVE

## 2023-11-09 LAB — CERVICOVAGINAL ANCILLARY ONLY
Chlamydia: NEGATIVE
Comment: NEGATIVE
Comment: NEGATIVE
Comment: NORMAL
Neisseria Gonorrhea: NEGATIVE
Trichomonas: NEGATIVE

## 2023-11-16 ENCOUNTER — Ambulatory Visit: Admitting: Family Medicine

## 2023-11-16 ENCOUNTER — Encounter: Payer: Self-pay | Admitting: Family Medicine

## 2023-11-16 ENCOUNTER — Ambulatory Visit: Payer: Self-pay | Admitting: Family Medicine

## 2023-11-16 VITALS — BP 118/82 | HR 62 | Ht 63.0 in | Wt 234.0 lb

## 2023-11-16 DIAGNOSIS — R300 Dysuria: Secondary | ICD-10-CM | POA: Diagnosis present

## 2023-11-16 LAB — POCT UA - MICROSCOPIC ONLY
Epithelial cells, urine per micros: >20
RBC, Urine, Miroscopic: >20 (ref 0–2)
WBC, Ur, HPF, POC: >20 (ref 0–5)

## 2023-11-16 LAB — POCT URINALYSIS DIP (MANUAL ENTRY)
Glucose, UA: NEGATIVE mg/dL
Nitrite, UA: NEGATIVE
Protein Ur, POC: >=300 mg/dL — AB
Spec Grav, UA: >=1.03 — AB (ref 1.010–1.025)
Urobilinogen, UA: 1 U/dL
pH, UA: 6 (ref 5.0–8.0)

## 2023-11-16 MED ORDER — NITROFURANTOIN MONOHYD MACRO 100 MG PO CAPS: 100.0000 mg | ORAL_CAPSULE | Freq: Two times a day (BID) | ORAL | 0 refills | Status: DC

## 2023-11-16 NOTE — Progress Notes (Signed)
    SUBJECTIVE:   CHIEF COMPLAINT / HPI:   Dysuria  Started having pain with urinating today. She noticed some blood when she wiped. Never had hematuria before.  Patient's LMP was 6/20 and lasted 4 days.  Patient uses 2 cigarettes per week. Quit vaping last Sunday.  2 sexual partners in the last 6 months.  No vaginal irritation or burning.  Denies fevers, abdominal pain, back pain.   PERTINENT  PMH / PSH: None pertinent   OBJECTIVE:   BP 118/82   Pulse 62   Ht 5' 3 (1.6 m)   Wt 234 lb (106.1 kg)   LMP 11/06/2023   SpO2 98%   BMI 41.45 kg/m   General: well appearing, in no acute distress CV: RRR, radial pulses equal and palpable, no BLE edema  Resp: Normal work of breathing on room air, CTAB Abd: Soft, non tender, non distended, no CVA tenderness  Neuro: Alert & Oriented x 4    ASSESSMENT/PLAN:   Assessment & Plan Dysuria Most likely an uncomplicated UTI given history of positive leukocytes with hematuria on POCT dipstick. Low concern for complicated UTI or pyelonephritis given no fever, abdominal pain, CVA tenderness.  - Macrobid 7 days  - F/u urine culture  - F/u UA in 2 weeks to ensure resolution of hematuria     Areta Saliva, MD [facility]

## 2023-11-16 NOTE — Patient Instructions (Addendum)
 It was wonderful to see you today.  Please bring ALL of your medications with you to every visit.   Today we talked about:  Pain with urination- Most likely you have a UTI. Ihave sent an antibiotic for the pharmacy. You should take it twice a day for 7 days.    Please follow up 2 weeks to recheck your urine for any blood and make sure you are feeling better   Thank you for choosing [facility].   Please call [phone] with any questions about today's appointment.  Please be sure to schedule follow up at the front desk before you leave today.   Areta Saliva, MD  Family Medicine

## 2023-11-19 LAB — URINE CULTURE

## 2023-11-30 ENCOUNTER — Ambulatory Visit: Payer: Self-pay | Admitting: Family Medicine

## 2023-11-30 ENCOUNTER — Encounter: Payer: Self-pay | Admitting: Family Medicine

## 2023-11-30 ENCOUNTER — Ambulatory Visit: Admitting: Family Medicine

## 2023-11-30 VITALS — BP 132/90 | HR 94 | Wt 235.4 lb

## 2023-11-30 DIAGNOSIS — Z3169 Encounter for other general counseling and advice on procreation: Secondary | ICD-10-CM | POA: Diagnosis not present

## 2023-11-30 DIAGNOSIS — Z87898 Personal history of other specified conditions: Secondary | ICD-10-CM | POA: Diagnosis present

## 2023-11-30 DIAGNOSIS — Z3200 Encounter for pregnancy test, result unknown: Secondary | ICD-10-CM

## 2023-11-30 LAB — POCT URINALYSIS DIP (MANUAL ENTRY)
Blood, UA: NEGATIVE
Glucose, UA: NEGATIVE mg/dL
Ketones, POC UA: NEGATIVE mg/dL
Leukocytes, UA: NEGATIVE
Nitrite, UA: NEGATIVE
Protein Ur, POC: NEGATIVE mg/dL
Spec Grav, UA: >=1.03 — AB (ref 1.010–1.025)
Urobilinogen, UA: 0.2 U/dL
pH, UA: 5.5 (ref 5.0–8.0)

## 2023-11-30 LAB — POCT URINE PREGNANCY: Preg Test, Ur: NEGATIVE

## 2023-11-30 MED ORDER — PREPLUS 27-1 MG PO TABS: 1.0000 | ORAL_TABLET | Freq: Every day | ORAL | 13 refills | Status: DC

## 2023-11-30 NOTE — Assessment & Plan Note (Signed)
 Now improved. Likely just due to acute infection. No microscopic hematuria on UA.  - No follow up needed.

## 2023-11-30 NOTE — Patient Instructions (Signed)
 It was wonderful to see you today.  Please bring ALL of your medications with you to every visit.   Today we talked about:  Blood in urine - I will let you know your test results once they come back and follow up depending on the results.   Possible pregnancy - I will let you know the result when we get It. Please start prenatal vitamins daily.    Thank you for choosing [facility].   Please call [phone] with any questions about today's appointment.  Areta Saliva, MD  Family Medicine

## 2023-11-30 NOTE — Progress Notes (Signed)
    SUBJECTIVE:   CHIEF COMPLAINT / HPI:   Gross hematuria  Mentions that she noticed resolution of gross hematuria 2 days after antibiotics that were prescribed 2 weeks ago. No dysuria. No abdominal or back pain. No fevers.   Possible pregnancy  Denies condom use consistently. Patient has had 2 sexual partners in the last 6 months. Is not interested in STI testing as she has had STI testing less than a month ago with the same partner. Not interested in birth control. She is not necessarily trying to get pregnant but is ok with it if she does get pregnant.   PERTINENT  PMH / PSH: Tobacco use  OBJECTIVE:   BP (!) 132/90   Pulse 94   Wt 235 lb 6.4 oz (106.8 kg)   LMP 11/07/2023 (Exact Date)   SpO2 100%   BMI 41.70 kg/m   General: well appearing, in no acute distress CV: RRR, radial pulses equal and palpable, no BLE edema  Resp: Normal work of breathing on room air, CTAB Abd: Soft, non tender, non distended    ASSESSMENT/PLAN:   Assessment & Plan History of gross hematuria Now improved. Likely just due to acute infection. No microscopic hematuria on UA.  - No follow up needed.  Possible pregnancy U preg negative. However cannot be reasonably certain given history.  Patient does not desire contraception at this time.  - Prenatal vitamins      Areta Saliva, MD [facility]

## 2023-12-23 ENCOUNTER — Ambulatory Visit

## 2023-12-23 ENCOUNTER — Other Ambulatory Visit (HOSPITAL_COMMUNITY)
Admission: RE | Admit: 2023-12-23 | Discharge: 2023-12-23 | Disposition: A | Discharge status: Home / Self Care | Source: Ambulatory Visit | Attending: Family Medicine | Admitting: Family Medicine

## 2023-12-23 VITALS — BP 125/90 | HR 99 | Ht 63.0 in | Wt 233.2 lb

## 2023-12-23 DIAGNOSIS — Z113 Encounter for screening for infections with a predominantly sexual mode of transmission: Secondary | ICD-10-CM | POA: Diagnosis present

## 2023-12-23 LAB — POCT WET PREP (WET MOUNT)
Clue Cells Wet Prep Whiff POC: NEGATIVE
Trichomonas Wet Prep HPF POC: ABSENT

## 2023-12-23 NOTE — Patient Instructions (Addendum)
 Dear Katherine Ayers,   It was great seeing you in clinic today! You came in for STI testing.  There are a few things for you to do outside of clinic: - The results of your tests will show up in MyChart over the next few days; I will get in touch with you if there are any abnormal results  Thank you for allowing me to be a part of your care team! Katherine Flies, MD [facility] [address] [place], [place], [place] [address] [phone]

## 2023-12-23 NOTE — Progress Notes (Signed)
    SUBJECTIVE:   CHIEF COMPLAINT / HPI:   Katherine Ayers is a 33 y.o. female presenting for STI testing.  STI Testing No specific symptoms other than ore discharge than usual, for past 1-2 weeks; reports feeling like something is off with her body and so is hoping to get STI testing done today. No new sexual partners. LMP: ~12/06/2023; has had sexual intercourse since. Contraception: None, no condoms, no interest in contraception at this time Not interested in pregnancy testing at this time Not due for Pap at this time (most recently done 05/2022)  Care Gaps - Tdap - Not interested at this time  OBJECTIVE:   BP (!) 125/90   Pulse 99   Ht 5' 3 (1.6 m)   Wt 233 lb 3.2 oz (105.8 kg)   LMP 11/07/2023 (Exact Date)   SpO2 100%   BMI 41.31 kg/m   Genital Exam: Normal external labia, normal vaginal walls, some white discharge from cervix. Swabs collected. Chaperone: Casimir Griffins  ASSESSMENT/PLAN:   Assessment & Plan Routine screening for STI (sexually transmitted infection) Discussed role of prenatal vitamins in women who are sexually active and able to get pregnant (not on contraception); patient will think about this and consider starting these again. Otherwise, not wanting contraception or pregnancy test; getting STI labs as below. - Labs: Wet prep, GC/Chlamydia/Trich, HIV, RPR    Alan Flies, MD [facility]

## 2023-12-24 ENCOUNTER — Other Ambulatory Visit: Payer: Self-pay

## 2023-12-24 DIAGNOSIS — Z113 Encounter for screening for infections with a predominantly sexual mode of transmission: Secondary | ICD-10-CM

## 2023-12-25 LAB — CERVICOVAGINAL ANCILLARY ONLY
Chlamydia: NEGATIVE
Comment: NEGATIVE
Comment: NORMAL
Neisseria Gonorrhea: NEGATIVE

## 2023-12-30 ENCOUNTER — Ambulatory Visit: Payer: Self-pay

## 2024-01-16 LAB — HIV ANTIBODY (ROUTINE TESTING W REFLEX)

## 2024-01-16 LAB — RPR: RPR Ser Ql: NONREACTIVE

## 2024-01-19 NOTE — Progress Notes (Unsigned)
    SUBJECTIVE:   CHIEF COMPLAINT / HPI:   STI check -Vaginal discharge, pain, itching: some more discharge than usual, otherwise ok -Abdominal pain, fevers: no -previous history: no -new partner or partner exposure to STI: no -desires oral swab: yes -LMP: 8/20 -contraception: not interested currently -pap: UTD, NILM 2024   PERTINENT  PMH / PSH: negative wet prep and G/C, RPR 12/23/23  OBJECTIVE:   BP 130/89   Pulse 88   Ht 5' 3 (1.6 m)   Wt 236 lb 4 oz (107.2 kg)   LMP 01/06/2024   SpO2 99%   BMI 41.85 kg/m   General: NAD, pleasant, able to participate in exam Respiratory: No respiratory distress Skin: warm and dry, no rashes noted Psych: Normal affect and mood  Pelvic exam: normal external genitalia, vulva, vagina, cervix, uterus and adnexa, exam chaperoned by Nelson CMA.   ASSESSMENT/PLAN:   Assessment & Plan Routine screening for STI (sexually transmitted infection) Bacterial vaginosis Wet prep shows evidence of BV. Rx sent for metronidazole  and diflucan  if needed after completing flagyl  Throat and vaginal swabs collected HIV, RPR Advised to reach out if interested in contraception   Payton Coward, MD [facility]

## 2024-01-20 ENCOUNTER — Other Ambulatory Visit (HOSPITAL_COMMUNITY)
Admission: RE | Admit: 2024-01-20 | Discharge: 2024-01-20 | Disposition: A | Discharge status: Home / Self Care | Source: Ambulatory Visit | Attending: Family Medicine | Admitting: Family Medicine

## 2024-01-20 ENCOUNTER — Ambulatory Visit

## 2024-01-20 ENCOUNTER — Ambulatory Visit: Payer: Self-pay | Admitting: Family Medicine

## 2024-01-20 VITALS — BP 130/89 | HR 88 | Ht 63.0 in | Wt 236.2 lb

## 2024-01-20 DIAGNOSIS — Z113 Encounter for screening for infections with a predominantly sexual mode of transmission: Secondary | ICD-10-CM

## 2024-01-20 DIAGNOSIS — N760 Acute vaginitis: Secondary | ICD-10-CM

## 2024-01-20 DIAGNOSIS — B9689 Other specified bacterial agents as the cause of diseases classified elsewhere: Secondary | ICD-10-CM

## 2024-01-20 LAB — POCT WET PREP (WET MOUNT)
Clue Cells Wet Prep Whiff POC: POSITIVE
Trichomonas Wet Prep HPF POC: ABSENT

## 2024-01-20 MED ORDER — FLUCONAZOLE 150 MG PO TABS
150.0000 mg | ORAL_TABLET | Freq: Once | ORAL | 0 refills | Status: AC
Start: 2024-01-20 — End: 2024-01-20

## 2024-01-20 MED ORDER — METRONIDAZOLE 500 MG PO TABS: 500.0000 mg | ORAL_TABLET | Freq: Two times a day (BID) | ORAL | 0 refills | Status: AC

## 2024-01-20 NOTE — Patient Instructions (Signed)
 If any of your results from today are abnormal and/or require changes to your medical care, I will give you a call. Otherwise, I will send you a letter in the mail or a message on MyChart.

## 2024-01-21 LAB — RPR: RPR Ser Ql: NONREACTIVE

## 2024-01-21 LAB — HIV ANTIBODY (ROUTINE TESTING W REFLEX): HIV Screen 4th Generation wRfx: NONREACTIVE

## 2024-01-22 LAB — CERVICOVAGINAL ANCILLARY ONLY
Bacterial Vaginitis (gardnerella): POSITIVE — AB
Candida Glabrata: NEGATIVE
Candida Vaginitis: NEGATIVE
Chlamydia: NEGATIVE
Chlamydia: NEGATIVE
Comment: NEGATIVE
Comment: NORMAL
Comment: NEGATIVE
Comment: NEGATIVE
Comment: NEGATIVE
Comment: NEGATIVE
Comment: NEGATIVE
Comment: NORMAL
Neisseria Gonorrhea: NEGATIVE
Neisseria Gonorrhea: NEGATIVE
Trichomonas: NEGATIVE

## 2024-02-22 ENCOUNTER — Ambulatory Visit: Admitting: Family Medicine

## 2024-02-22 ENCOUNTER — Ambulatory Visit

## 2024-02-22 ENCOUNTER — Other Ambulatory Visit (HOSPITAL_COMMUNITY)
Admission: RE | Admit: 2024-02-22 | Discharge: 2024-02-22 | Disposition: A | Discharge status: Home / Self Care | Source: Ambulatory Visit

## 2024-02-22 VITALS — BP 127/89 | HR 89 | Ht 63.0 in | Wt 238.2 lb

## 2024-02-22 DIAGNOSIS — N898 Other specified noninflammatory disorders of vagina: Secondary | ICD-10-CM | POA: Diagnosis present

## 2024-02-22 DIAGNOSIS — Z113 Encounter for screening for infections with a predominantly sexual mode of transmission: Secondary | ICD-10-CM

## 2024-02-22 NOTE — Patient Instructions (Signed)
 It was good to see you today.   Please bring ALL of your medications with you to every visit.    Today we talked about: STI screening and vaginal discharge    Thank you for choosing [facility]. Please refer to your mychart for specifics regarding today's visit or future appointments.

## 2024-02-22 NOTE — Progress Notes (Signed)
    SUBJECTIVE:   CHIEF COMPLAINT / HPI:   Vaginal Discharge - Increased amount. No change in color, smell - No new sexual partners, no change in period, no abdominal pain, dysuria - No recent abx or other medication changes  PERTINENT  PMH / PSH: none  OBJECTIVE:   BP (!) 146/93   Pulse 89   Ht 5' 3 (1.6 m)   Wt 238 lb 3.2 oz (108 kg)   SpO2 99%   BMI 42.20 kg/m   Physical Exam General: Alert, conversant, cooperative. No acute distress.  HEENT: PERRL. EOMI. MMM.  Cardiovascular: RRR Respiratory: Lungs CTAB. Normal work of breathing. Extremities: No cyanosis. No edema Musculoskeletal: No gross deformities.  Skin: Warm. Dry. No rashes. No icterus.  Neurologic: No focal deficits. Moving all extremities. Psychiatric: Cooperative. Appropriate mood. Appropriate affect. Pelvic: Cervix without erythema or inflammation.    ASSESSMENT/PLAN:   Assessment & Plan Vaginal discharge - normal pelvic exam, will send swabs for G/C and labs for HIV and RPR. Treat as indicated Screening examination for STI - will send swabs for G/C and labs for HIV and RPR     Milda LITTIE Deed, MD [facility]

## 2024-02-23 ENCOUNTER — Ambulatory Visit: Payer: Self-pay

## 2024-02-23 LAB — CERVICOVAGINAL ANCILLARY ONLY
Bacterial Vaginitis (gardnerella): POSITIVE — AB
Candida Glabrata: NEGATIVE
Candida Vaginitis: NEGATIVE
Chlamydia: NEGATIVE
Comment: NEGATIVE
Comment: NEGATIVE
Comment: NEGATIVE
Comment: NEGATIVE
Comment: NEGATIVE
Comment: NORMAL
Neisseria Gonorrhea: NEGATIVE
Trichomonas: NEGATIVE

## 2024-02-23 LAB — RPR: RPR Ser Ql: NONREACTIVE

## 2024-02-23 LAB — HIV ANTIBODY (ROUTINE TESTING W REFLEX): HIV Screen 4th Generation wRfx: NONREACTIVE

## 2024-02-24 MED ORDER — METRONIDAZOLE 500 MG PO TABS: 500.0000 mg | ORAL_TABLET | Freq: Two times a day (BID) | ORAL | 0 refills | Status: AC

## 2024-03-22 ENCOUNTER — Ambulatory Visit: Admitting: Family Medicine

## 2024-03-23 ENCOUNTER — Ambulatory Visit: Payer: Self-pay | Admitting: Family Medicine

## 2024-03-23 ENCOUNTER — Ambulatory Visit: Admitting: Family Medicine

## 2024-03-23 ENCOUNTER — Other Ambulatory Visit (HOSPITAL_COMMUNITY)
Admission: RE | Admit: 2024-03-23 | Discharge: 2024-03-23 | Disposition: A | Discharge status: Home / Self Care | Source: Ambulatory Visit | Attending: Family Medicine | Admitting: Family Medicine

## 2024-03-23 VITALS — BP 126/95 | HR 92 | Temp 98.4°F | Ht 63.0 in | Wt 234.6 lb

## 2024-03-23 DIAGNOSIS — N898 Other specified noninflammatory disorders of vagina: Secondary | ICD-10-CM | POA: Diagnosis present

## 2024-03-23 LAB — POCT WET PREP (WET MOUNT)
Clue Cells Wet Prep Whiff POC: NEGATIVE
Trichomonas Wet Prep HPF POC: ABSENT

## 2024-03-23 NOTE — Progress Notes (Unsigned)
   SUBJECTIVE:   CHIEF COMPLAINT / HPI:  Discussed the use of AI scribe software for clinical note transcription with the patient, who gave verbal consent to proceed.  History of Present Illness     PERTINENT  PMH / PSH: ***  OBJECTIVE:  BP (!) 126/95   Pulse 92   Temp 98.4 F (36.9 C)   Ht 5' 3 (1.6 m)   Wt 234 lb 9.6 oz (106.4 kg)   LMP 03/05/2024   SpO2 100%   BMI 41.56 kg/m   Physical Exam   General: well appearing, in no acute distress CV: RRR, radial pulses equal and palpable, no BLE edema  Resp: Normal work of breathing on room air, CTAB Abd: Soft, non tender, non distended  Neuro: Alert & Oriented x 4   ASSESSMENT/PLAN:   Assessment & Plan   Assessment and Plan Assessment & Plan      Katherine Saliva, MD [facility]

## 2024-03-23 NOTE — Patient Instructions (Signed)
 It was wonderful to see you today.  Please bring ALL of your medications with you to every visit.   Today we talked about:  STI testing- I will let you know your results when we get them. To prevent recurrent BV please urinate as soon as you have intercourse. We can also prescribe antibiotics to help prevent recurrent BV. Your partner could also get treated to prevent this. For timing intercourse to help with conceiving you can time it with ovulation which is about 14 days before the start of your next period. You can buy over the counter kits to help time ovulation. We can work up further infertility if you would like at a next visit. I recommend starting prenatal vitamins.    Thank you for choosing [facility].   Please call [phone] with any questions about today's appointment.  Areta Saliva, MD  Family Medicine

## 2024-03-24 LAB — RPR: RPR Ser Ql: NONREACTIVE

## 2024-03-24 LAB — HIV ANTIBODY (ROUTINE TESTING W REFLEX): HIV Screen 4th Generation wRfx: NONREACTIVE

## 2024-03-25 LAB — CERVICOVAGINAL ANCILLARY ONLY
Chlamydia: NEGATIVE
Comment: NEGATIVE
Comment: NEGATIVE
Comment: NORMAL
Neisseria Gonorrhea: NEGATIVE
Trichomonas: NEGATIVE

## 2024-04-18 ENCOUNTER — Ambulatory Visit

## 2024-04-26 ENCOUNTER — Encounter: Payer: Self-pay | Admitting: Family Medicine

## 2024-04-26 ENCOUNTER — Other Ambulatory Visit (HOSPITAL_COMMUNITY)
Admission: RE | Admit: 2024-04-26 | Discharge: 2024-04-26 | Disposition: A | Discharge status: Home / Self Care | Source: Ambulatory Visit | Attending: Family Medicine | Admitting: Family Medicine

## 2024-04-26 ENCOUNTER — Ambulatory Visit: Admitting: Family Medicine

## 2024-04-26 VITALS — BP 112/74 | HR 92 | Ht 63.0 in | Wt 235.0 lb

## 2024-04-26 DIAGNOSIS — N898 Other specified noninflammatory disorders of vagina: Secondary | ICD-10-CM

## 2024-04-26 DIAGNOSIS — Z113 Encounter for screening for infections with a predominantly sexual mode of transmission: Secondary | ICD-10-CM

## 2024-04-26 LAB — POCT WET PREP (WET MOUNT)
Clue Cells Wet Prep Whiff POC: NEGATIVE
Trichomonas Wet Prep HPF POC: ABSENT
WBC, Wet Prep HPF POC: >20

## 2024-04-26 NOTE — Patient Instructions (Addendum)
 It was wonderful to see you today.  Please bring ALL of your medications with you to every visit.   VISIT SUMMARY: Today, you came in for an evaluation due to an increase in vaginal discharge. We discussed your history of recurrent bacterial vaginosis and performed tests to determine the cause of your symptoms. We also conducted screening for sexually transmitted infections (STIs) as a precaution.  YOUR PLAN: -VAGINAL DISCHARGE: You have experienced an increase in vaginal discharge, which may be due to recurrent bacterial vaginosis, a condition where the normal balance of bacteria in the vagina is disrupted. We performed a wet prep test to check for yeast and bacterial vaginosis. Additionally, we recommend that your partner also receive treatment to help prevent the recurrence of this condition.  -SCREENING FOR SEXUALLY TRANSMITTED INFECTIONS: Given your history of recurrent bacterial vaginosis and the current increase in vaginal discharge, we conducted STI testing. This included both a vaginal swab and blood tests to ensure a comprehensive screening.  INSTRUCTIONS: Please follow up with us  once the test results are available. If your symptoms worsen or you experience any new symptoms, contact our office immediately.  Contains text generated by [employer].   Thank you for choosing [facility].   Please call [phone] with any questions about today's appointment.  Areta Saliva, MD  Family Medicine

## 2024-04-26 NOTE — Progress Notes (Unsigned)
   SUBJECTIVE:   CHIEF COMPLAINT / HPI:  Discussed the use of AI scribe software for clinical note transcription with the patient, who gave verbal consent to proceed.  History of Present Illness Katherine Ayers is a 33 year old female with recurrent bacterial vaginosis who presents for STI testing due to vaginal discharge. She is accompanied by her son.  Vaginal discharge - Frequent episodes of vaginal discharge and irritation related to bacterial vaginosis in the past - Current increase in vaginal discharge began approximately two weeks ago, just before Thanksgiving - Current discharge characterized by increased volume only, without irritation or change in color - No similar cyclical changes in discharge previously - Last evaluation for discharge showed negative testing for bacterial vaginosis  Urinary symptoms - One brief episode of urinary urgency after holding urine - No ongoing dysuria, frequency, or other urinary symptoms  Sexual and contraceptive history - Not using birth control and does not desire contraception - One sexual partner in the past six months - No recent partner changes    PERTINENT  PMH / PSH: Recurrent BV   OBJECTIVE:  BP 112/74   Pulse 92   Ht 5' 3 (1.6 m)   Wt 235 lb (106.6 kg)   LMP 04/03/2024   SpO2 98%   BMI 41.63 kg/m   Physical Exam   General: well appearing, in no acute distress CV: RRR, radial pulses equal and palpable, no BLE edema  Resp: Normal work of breathing on room air, CTAB Abd: Soft, non tender, non distended  Neuro: Alert & Oriented x 4   ASSESSMENT/PLAN:   Assessment & Plan Vaginal discharge  Screening for STD (sexually transmitted disease)   Assessment and Plan Assessment & Plan Vaginal discharge Recurrent bacterial vaginosis suspected despite previous negative tests. - Performed wet prep test for yeast and BV. - Recommended partner treatment for BV to prevent recurrence.  Screening for sexually transmitted  infections STI screening indicated due to recurrent discharge and BV history. - Performed vaginal STI testing. - Ordered blood tests for STI screening.     Areta Saliva, MD [facility]

## 2024-04-27 ENCOUNTER — Ambulatory Visit: Payer: Self-pay | Admitting: Family Medicine

## 2024-04-27 LAB — CERVICOVAGINAL ANCILLARY ONLY
Chlamydia: NEGATIVE
Comment: NEGATIVE
Comment: NEGATIVE
Comment: NORMAL
Neisseria Gonorrhea: NEGATIVE
Trichomonas: NEGATIVE

## 2024-04-27 MED ORDER — METRONIDAZOLE 500 MG PO TABS: 500.0000 mg | ORAL_TABLET | Freq: Two times a day (BID) | ORAL | 0 refills | Status: AC

## 2024-04-29 ENCOUNTER — Other Ambulatory Visit

## 2024-05-02 ENCOUNTER — Other Ambulatory Visit

## 2024-05-02 DIAGNOSIS — Z113 Encounter for screening for infections with a predominantly sexual mode of transmission: Secondary | ICD-10-CM

## 2024-05-03 LAB — SYPHILIS: RPR W/REFLEX TO RPR TITER AND TREPONEMAL ANTIBODIES, TRADITIONAL SCREENING AND DIAGNOSIS ALGORITHM: RPR Ser Ql: NONREACTIVE

## 2024-05-03 LAB — HIV ANTIBODY (ROUTINE TESTING W REFLEX): HIV Screen 4th Generation wRfx: NONREACTIVE

## 2024-06-01 ENCOUNTER — Other Ambulatory Visit: Payer: Self-pay | Admitting: Family Medicine

## 2024-06-01 DIAGNOSIS — B9689 Other specified bacterial agents as the cause of diseases classified elsewhere: Secondary | ICD-10-CM

## 2024-07-12 ENCOUNTER — Ambulatory Visit: Admitting: Family Medicine

## 2024-07-13 ENCOUNTER — Ambulatory Visit: Payer: Self-pay | Admitting: Family Medicine
